# Patient Record
Sex: Male | Born: 1955
Health system: Southern US, Community
[De-identification: ages and names within clinical notes are randomized; demographics above are authoritative.]

## PROBLEM LIST (undated history)

## (undated) DIAGNOSIS — I10 Essential (primary) hypertension: Secondary | ICD-10-CM

## (undated) DIAGNOSIS — G4730 Sleep apnea, unspecified: Secondary | ICD-10-CM

## (undated) DIAGNOSIS — J189 Pneumonia, unspecified organism: Secondary | ICD-10-CM

## (undated) DIAGNOSIS — K649 Unspecified hemorrhoids: Secondary | ICD-10-CM

## (undated) DIAGNOSIS — E291 Testicular hypofunction: Secondary | ICD-10-CM

## (undated) DIAGNOSIS — T7840XA Allergy, unspecified, initial encounter: Secondary | ICD-10-CM

## (undated) DIAGNOSIS — H269 Unspecified cataract: Secondary | ICD-10-CM

## (undated) DIAGNOSIS — N200 Calculus of kidney: Secondary | ICD-10-CM

## (undated) DIAGNOSIS — K429 Umbilical hernia without obstruction or gangrene: Secondary | ICD-10-CM

## (undated) DIAGNOSIS — E079 Disorder of thyroid, unspecified: Secondary | ICD-10-CM

## (undated) DIAGNOSIS — J302 Other seasonal allergic rhinitis: Secondary | ICD-10-CM

## (undated) DIAGNOSIS — D649 Anemia, unspecified: Secondary | ICD-10-CM

## (undated) DIAGNOSIS — E785 Hyperlipidemia, unspecified: Secondary | ICD-10-CM

## (undated) DIAGNOSIS — R011 Cardiac murmur, unspecified: Secondary | ICD-10-CM

## (undated) HISTORY — PX: VASECTOMY: SHX75

## (undated) HISTORY — DX: Essential (primary) hypertension: I10

## (undated) HISTORY — DX: Allergy, unspecified, initial encounter: T78.40XA

## (undated) HISTORY — DX: Sleep apnea, unspecified: G47.30

## (undated) HISTORY — PX: TONSILLECTOMY: SHX5217

## (undated) HISTORY — DX: Hyperlipidemia, unspecified: E78.5

## (undated) HISTORY — DX: Testicular hypofunction: E29.1

## (undated) HISTORY — DX: Disorder of thyroid, unspecified: E07.9

## (undated) HISTORY — PX: UVULOPALATOPHARYNGOPLASTY: SHX827

## (undated) HISTORY — PX: EYE SURGERY: SHX253

## (undated) HISTORY — DX: Unspecified cataract: H26.9

## (undated) HISTORY — PX: WISDOM TOOTH EXTRACTION: SHX21

## (undated) HISTORY — DX: Cardiac murmur, unspecified: R01.1

---

## 2004-08-08 ENCOUNTER — Encounter: Admission: RE | Admit: 2004-08-08 | Discharge: 2004-08-08 | Payer: Self-pay | Admitting: Family Medicine

## 2004-09-13 ENCOUNTER — Ambulatory Visit (HOSPITAL_COMMUNITY): Admission: RE | Admit: 2004-09-13 | Discharge: 2004-09-13 | Payer: Self-pay | Admitting: Gastroenterology

## 2004-09-13 ENCOUNTER — Encounter (INDEPENDENT_AMBULATORY_CARE_PROVIDER_SITE_OTHER): Payer: Self-pay | Admitting: Specialist

## 2005-09-01 ENCOUNTER — Ambulatory Visit: Payer: Self-pay | Admitting: Internal Medicine

## 2005-09-17 ENCOUNTER — Ambulatory Visit (HOSPITAL_BASED_OUTPATIENT_CLINIC_OR_DEPARTMENT_OTHER): Admission: RE | Admit: 2005-09-17 | Discharge: 2005-09-17 | Payer: Self-pay | Admitting: Otolaryngology

## 2005-09-21 ENCOUNTER — Ambulatory Visit: Payer: Self-pay | Admitting: Internal Medicine

## 2005-10-14 ENCOUNTER — Encounter (INDEPENDENT_AMBULATORY_CARE_PROVIDER_SITE_OTHER): Payer: Self-pay | Admitting: Specialist

## 2005-10-14 ENCOUNTER — Ambulatory Visit (HOSPITAL_COMMUNITY): Admission: RE | Admit: 2005-10-14 | Discharge: 2005-10-15 | Payer: Self-pay | Admitting: Otolaryngology

## 2006-12-11 ENCOUNTER — Encounter: Payer: Self-pay | Admitting: Family Medicine

## 2007-06-02 ENCOUNTER — Ambulatory Visit: Payer: Self-pay | Admitting: Family Medicine

## 2007-06-02 DIAGNOSIS — K649 Unspecified hemorrhoids: Secondary | ICD-10-CM

## 2007-06-02 DIAGNOSIS — E782 Mixed hyperlipidemia: Secondary | ICD-10-CM | POA: Insufficient documentation

## 2007-06-02 DIAGNOSIS — E039 Hypothyroidism, unspecified: Secondary | ICD-10-CM | POA: Insufficient documentation

## 2007-06-02 DIAGNOSIS — R51 Headache: Secondary | ICD-10-CM

## 2007-06-02 DIAGNOSIS — J45909 Unspecified asthma, uncomplicated: Secondary | ICD-10-CM | POA: Insufficient documentation

## 2007-06-02 DIAGNOSIS — R040 Epistaxis: Secondary | ICD-10-CM

## 2007-06-02 DIAGNOSIS — M722 Plantar fascial fibromatosis: Secondary | ICD-10-CM

## 2007-06-02 DIAGNOSIS — I10 Essential (primary) hypertension: Secondary | ICD-10-CM | POA: Insufficient documentation

## 2007-06-03 ENCOUNTER — Telehealth: Payer: Self-pay | Admitting: Family Medicine

## 2007-06-09 ENCOUNTER — Encounter: Payer: Self-pay | Admitting: Family Medicine

## 2007-06-28 ENCOUNTER — Encounter: Payer: Self-pay | Admitting: Family Medicine

## 2007-08-03 ENCOUNTER — Encounter (INDEPENDENT_AMBULATORY_CARE_PROVIDER_SITE_OTHER): Payer: Self-pay | Admitting: General Surgery

## 2007-08-03 ENCOUNTER — Ambulatory Visit (HOSPITAL_COMMUNITY): Admission: RE | Admit: 2007-08-03 | Discharge: 2007-08-03 | Payer: Self-pay | Admitting: General Surgery

## 2007-08-03 HISTORY — PX: HERNIA REPAIR: SHX51

## 2007-08-09 ENCOUNTER — Ambulatory Visit: Payer: Self-pay | Admitting: Family Medicine

## 2007-08-09 LAB — CONVERTED CEMR LAB
Glucose, Urine, Semiquant: NEGATIVE
Nitrite: NEGATIVE
Protein, U semiquant: NEGATIVE
Specific Gravity, Urine: 1.01
Urobilinogen, UA: 0.2

## 2007-08-10 LAB — CONVERTED CEMR LAB
ALT: 26 units/L (ref 0–53)
Albumin: 4.2 g/dL (ref 3.5–5.2)
BUN: 23 mg/dL (ref 6–23)
Basophils Absolute: 0 10*3/uL (ref 0.0–0.1)
Basophils Relative: 0.1 % (ref 0.0–1.0)
Chloride: 108 meq/L (ref 96–112)
Cholesterol: 173 mg/dL (ref 0–200)
Eosinophils Absolute: 0.1 10*3/uL (ref 0.0–0.7)
Eosinophils Relative: 2 % (ref 0.0–5.0)
LDL Cholesterol: 113 mg/dL — ABNORMAL HIGH (ref 0–99)
Lymphocytes Relative: 10.7 % — ABNORMAL LOW (ref 12.0–46.0)
MCV: 88.7 fL (ref 78.0–100.0)
Neutro Abs: 5.7 10*3/uL (ref 1.4–7.7)
Neutrophils Relative %: 79.9 % — ABNORMAL HIGH (ref 43.0–77.0)
PSA: 0.45 ng/mL (ref 0.10–4.00)
Platelets: 251 10*3/uL (ref 150–400)
RBC: 4.76 M/uL (ref 4.22–5.81)
TSH: 0.5 microintl units/mL (ref 0.35–5.50)
Total CHOL/HDL Ratio: 4.5
VLDL: 22 mg/dL (ref 0–40)
WBC: 7.1 10*3/uL (ref 4.5–10.5)

## 2007-08-13 ENCOUNTER — Telehealth: Payer: Self-pay | Admitting: Family Medicine

## 2007-08-19 ENCOUNTER — Encounter: Admission: RE | Admit: 2007-08-19 | Discharge: 2007-08-19 | Payer: Self-pay | Admitting: Family Medicine

## 2007-08-19 ENCOUNTER — Encounter: Payer: Self-pay | Admitting: Family Medicine

## 2007-10-04 ENCOUNTER — Encounter: Payer: Self-pay | Admitting: Family Medicine

## 2007-12-24 ENCOUNTER — Telehealth: Payer: Self-pay | Admitting: Family Medicine

## 2008-01-03 ENCOUNTER — Encounter: Admission: RE | Admit: 2008-01-03 | Discharge: 2008-01-03 | Payer: Self-pay | Admitting: Family Medicine

## 2008-02-08 ENCOUNTER — Telehealth: Payer: Self-pay | Admitting: Family Medicine

## 2008-03-27 ENCOUNTER — Telehealth: Payer: Self-pay | Admitting: Family Medicine

## 2008-03-27 ENCOUNTER — Ambulatory Visit: Payer: Self-pay | Admitting: Family Medicine

## 2008-03-27 DIAGNOSIS — J019 Acute sinusitis, unspecified: Secondary | ICD-10-CM | POA: Insufficient documentation

## 2008-05-05 ENCOUNTER — Emergency Department (HOSPITAL_COMMUNITY): Admission: EM | Admit: 2008-05-05 | Discharge: 2008-05-05 | Payer: Self-pay | Admitting: Emergency Medicine

## 2008-08-02 ENCOUNTER — Telehealth: Payer: Self-pay | Admitting: Family Medicine

## 2008-08-14 ENCOUNTER — Ambulatory Visit: Payer: Self-pay | Admitting: Family Medicine

## 2008-08-14 DIAGNOSIS — E119 Type 2 diabetes mellitus without complications: Secondary | ICD-10-CM | POA: Insufficient documentation

## 2008-08-14 LAB — CONVERTED CEMR LAB
Bilirubin Urine: NEGATIVE
Blood in Urine, dipstick: NEGATIVE
Glucose, Urine, Semiquant: NEGATIVE
Ketones, urine, test strip: NEGATIVE
Nitrite: NEGATIVE
Protein, U semiquant: NEGATIVE
Specific Gravity, Urine: 1.015
Urobilinogen, UA: 0.2
WBC Urine, dipstick: NEGATIVE
pH: 7.5

## 2008-08-16 LAB — CONVERTED CEMR LAB
ALT: 24 units/L (ref 0–53)
AST: 19 units/L (ref 0–37)
Alkaline Phosphatase: 66 units/L (ref 39–117)
Basophils Absolute: 0 10*3/uL (ref 0.0–0.1)
Basophils Relative: 0.1 % (ref 0.0–3.0)
Bilirubin, Direct: 0.1 mg/dL (ref 0.0–0.3)
Cholesterol: 190 mg/dL (ref 0–200)
Creatinine,U: 31 mg/dL
Eosinophils Absolute: 0.1 10*3/uL (ref 0.0–0.7)
GFR calc non Af Amer: 93.12 mL/min (ref >60)
Hemoglobin: 15.4 g/dL (ref 13.0–17.0)
Hgb A1c MFr Bld: 5.9 % (ref 4.6–6.5)
Lymphocytes Relative: 15 % (ref 12.0–46.0)
MCHC: 34.7 g/dL (ref 30.0–36.0)
MCV: 86.8 fL (ref 78.0–100.0)
Microalb Creat Ratio: 6.5 mg/g (ref 0.0–30.0)
Monocytes Absolute: 0.6 10*3/uL (ref 0.1–1.0)
RDW: 12.8 % (ref 11.5–14.6)
Sodium: 138 meq/L (ref 135–145)
Total Protein: 6.9 g/dL (ref 6.0–8.3)
Triglycerides: 203 mg/dL — ABNORMAL HIGH (ref 0.0–149.0)
WBC: 7.8 10*3/uL (ref 4.5–10.5)

## 2009-01-08 ENCOUNTER — Telehealth: Payer: Self-pay | Admitting: Family Medicine

## 2009-02-06 ENCOUNTER — Ambulatory Visit: Payer: Self-pay | Admitting: Gastroenterology

## 2009-02-19 ENCOUNTER — Ambulatory Visit: Payer: Self-pay | Admitting: Gastroenterology

## 2009-02-19 HISTORY — PX: COLONOSCOPY: SHX174

## 2009-02-19 LAB — HM COLONOSCOPY

## 2009-02-28 ENCOUNTER — Encounter: Payer: Self-pay | Admitting: Gastroenterology

## 2009-04-17 ENCOUNTER — Telehealth: Payer: Self-pay | Admitting: Family Medicine

## 2009-04-18 ENCOUNTER — Ambulatory Visit: Payer: Self-pay | Admitting: Family Medicine

## 2009-08-09 ENCOUNTER — Encounter: Payer: Self-pay | Admitting: Family Medicine

## 2009-08-13 ENCOUNTER — Telehealth: Payer: Self-pay | Admitting: Family Medicine

## 2009-09-20 ENCOUNTER — Ambulatory Visit: Payer: Self-pay | Admitting: Family Medicine

## 2009-09-20 DIAGNOSIS — Z77090 Contact with and (suspected) exposure to asbestos: Secondary | ICD-10-CM | POA: Insufficient documentation

## 2009-09-20 LAB — CONVERTED CEMR LAB
Bilirubin Urine: NEGATIVE
Blood in Urine, dipstick: NEGATIVE
Glucose, Urine, Semiquant: NEGATIVE
Ketones, urine, test strip: NEGATIVE
Urobilinogen, UA: 0.2
WBC Urine, dipstick: NEGATIVE

## 2009-09-21 LAB — CONVERTED CEMR LAB
AST: 21 units/L (ref 0–37)
Albumin: 4.5 g/dL (ref 3.5–5.2)
BUN: 25 mg/dL — ABNORMAL HIGH (ref 6–23)
Bilirubin, Direct: 0.1 mg/dL (ref 0.0–0.3)
Calcium: 9.2 mg/dL (ref 8.4–10.5)
Eosinophils Relative: 1.2 % (ref 0.0–5.0)
GFR calc non Af Amer: 114.46 mL/min (ref >60)
LDL Cholesterol: 84 mg/dL (ref 0–99)
Lymphocytes Relative: 16.8 % (ref 12.0–46.0)
Lymphs Abs: 1.4 10*3/uL (ref 0.7–4.0)
MCHC: 34.6 g/dL (ref 30.0–36.0)
Monocytes Absolute: 0.6 10*3/uL (ref 0.1–1.0)
Monocytes Relative: 7.4 % (ref 3.0–12.0)
Neutro Abs: 6 10*3/uL (ref 1.4–7.7)
Neutrophils Relative %: 74.1 % (ref 43.0–77.0)
PSA: 0.67 ng/mL (ref 0.10–4.00)
Sodium: 139 meq/L (ref 135–145)
TSH: 1.21 microintl units/mL (ref 0.35–5.50)
Total Protein: 7.3 g/dL (ref 6.0–8.3)
VLDL: 31.8 mg/dL (ref 0.0–40.0)
WBC: 8.1 10*3/uL (ref 4.5–10.5)

## 2009-12-20 ENCOUNTER — Telehealth: Payer: Self-pay | Admitting: Family Medicine

## 2010-01-10 ENCOUNTER — Emergency Department: Payer: Self-pay | Admitting: Emergency Medicine

## 2010-03-31 DIAGNOSIS — J189 Pneumonia, unspecified organism: Secondary | ICD-10-CM

## 2010-03-31 HISTORY — DX: Pneumonia, unspecified organism: J18.9

## 2010-04-30 NOTE — Assessment & Plan Note (Signed)
Summary: SINUSITIS? // RS   Vital Signs:  Patient profile:   55 year old male Weight:      222 pounds Temp:     99.0 degrees F oral BP sitting:   110 / 72  (left arm) Cuff size:   regular  Vitals Entered By: Alfred Levins, CMA (April 18, 2009 10:19 AM) CC: cough, congestion.  He has tried mucinex and dayquil   History of Present Illness: One week of sinus pressure, PND, HA, ST, and a dry cough. No fever. On Mucinex.   Current Medications (verified): 1)  Pravastatin Sodium 40 Mg  Tabs (Pravastatin Sodium) .... Once Daily 2)  Fish Oil   Oil (Fish Oil) .... 3 Once Daily 3)  Verapamil Hcl Cr 240 Mg  Cp24 (Verapamil Hcl) .... Once Daily 4)  Zestoretic 20-12.5 Mg  Tabs (Lisinopril-Hydrochlorothiazide) .... Once Daily 5)  Glucophage Xr 500 Mg Xr24h-Tab (Metformin Hcl) .... Take One Tab Two Times A Day 6)  Centrum Silver Ultra Mens  Tabs (Multiple Vitamins-Minerals) .... Once Daily 7)  Cinnamon 500 Mg Caps (Cinnamon) .... Take One Tab Two Times A Day 8)  Acai Berry 500 Mg Caps (Acai) .... Take Once Daily  Allergies (verified): 1)  ! Pcn  Past History:  Past Medical History: Reviewed history from 08/14/2008 and no changes required. cluster migraines, had seen [facility] Hyperlipidemia Hypertension heart murmur as a child sleep apnea, saw Dr. Fannie Knee Hypothyroidism Asthma, exertional insulin resistance, sees Dr. Talmage Nap  Review of Systems  The patient denies anorexia, fever, weight loss, weight gain, vision loss, decreased hearing, hoarseness, chest pain, syncope, dyspnea on exertion, peripheral edema, hemoptysis, abdominal pain, melena, hematochezia, severe indigestion/heartburn, hematuria, incontinence, genital sores, muscle weakness, suspicious skin lesions, transient blindness, difficulty walking, depression, unusual weight change, abnormal bleeding, enlarged lymph nodes, angioedema, breast masses, and testicular masses.    Physical Exam  General:   Well-developed,well-nourished,in no acute distress; alert,appropriate and cooperative throughout examination Head:  Normocephalic and atraumatic without obvious abnormalities. No apparent alopecia or balding. Eyes:  No corneal or conjunctival inflammation noted. EOMI. Perrla. Funduscopic exam benign, without hemorrhages, exudates or papilledema. Vision grossly normal. Ears:  External ear exam shows no significant lesions or deformities.  Otoscopic examination reveals clear canals, tympanic membranes are intact bilaterally without bulging, retraction, inflammation or discharge. Hearing is grossly normal bilaterally. Nose:  External nasal examination shows no deformity or inflammation. Nasal mucosa are pink and moist without lesions or exudates. Mouth:  Oral mucosa and oropharynx without lesions or exudates.  Teeth in good repair. Neck:  No deformities, masses, or tenderness noted. Lungs:  Normal respiratory effort, chest expands symmetrically. Lungs are clear to auscultation, no crackles or wheezes.   Impression & Recommendations:  Problem # 1:  ACUTE SINUSITIS, UNSPECIFIED (ICD-461.9)  His updated medication list for this problem includes:    Zithromax Z-pak 250 Mg Tabs (Azithromycin) .Marland Kitchen... As directed    Hydromet 5-1.5 Mg/32ml Syrp (Hydrocodone-homatropine) .Marland Kitchen... 1 q 4 hours as needed cough  Complete Medication List: 1)  Pravastatin Sodium 40 Mg Tabs (Pravastatin sodium) .... Once daily 2)  Fish Oil Oil (Fish oil) .... 3 once daily 3)  Verapamil Hcl Cr 240 Mg Cp24 (Verapamil hcl) .... Once daily 4)  Zestoretic 20-12.5 Mg Tabs (Lisinopril-hydrochlorothiazide) .... Once daily 5)  Glucophage Xr 500 Mg Xr24h-tab (Metformin hcl) .... Take one tab two times a day 6)  Centrum Silver Ultra Mens Tabs (Multiple vitamins-minerals) .... Once daily 7)  Cinnamon 500 Mg Caps (Cinnamon) .Marland KitchenMarland KitchenMarland Kitchen  Take one tab two times a day 8)  Acai Berry 500 Mg Caps (Acai) .... Take once daily 9)  Zithromax Z-pak 250 Mg Tabs  (Azithromycin) .... As directed 10)  Hydromet 5-1.5 Mg/58ml Syrp (Hydrocodone-homatropine) .Marland Kitchen.. 1 q 4 hours as needed cough  Patient Instructions: 1)  Please schedule a follow-up appointment as needed .  Prescriptions: HYDROMET 5-1.5 MG/5ML SYRP (HYDROCODONE-HOMATROPINE) 1 q 4 hours as needed cough  #240 x 0   Entered and Authorized by:   Nelwyn Salisbury MD   Signed by:   Nelwyn Salisbury MD on 04/18/2009   Method used:   Print then Give to Patient   RxID:   [id number] ZITHROMAX Z-PAK 250 MG TABS (AZITHROMYCIN) as directed  #1 x 0   Entered and Authorized by:   Nelwyn Salisbury MD   Signed by:   Nelwyn Salisbury MD on 04/18/2009   Method used:   Print then Give to Patient   RxID:   [id number]

## 2010-04-30 NOTE — Assessment & Plan Note (Signed)
Summary: cpx/pt will come in fasting/njr rsc bmp/njr   Vital Signs:  Patient profile:   55 year old male Weight:      215 pounds BMI:     36.75 BP sitting:   130 / 78  (left arm) Cuff size:   regular  Vitals Entered By: Raechel Ache, RN (September 20, 2009 9:09 AM) CC: CPX, fasting. Wants to be checked for asbestos lung- worked in [employer] for 7 yrs.   History of Present Illness: 55 yr old male for a cpx. He has a number of questions today. he feels fairly well in general. He watches his diet closely, and his DM has been well controlled. His A1c was excellent last month when he saw Dr. Talmage Nap. However he is frustrated by his inablility to lose weight. He admits to getting no exercise at all other than the walking he does on his job every day. 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 He denies any SOB.  Allergies: 1)  ! Pcn  Past History:  Past Medical History: cluster migraines, had seen [facility] Hyperlipidemia Hypertension heart murmur as a child sleep apnea, saw Dr. Fannie Knee Hypothyroidism Asthma, exertional type II DM, sees Dr. Talmage Nap  Past Surgical History: Tonsillectomy uvulopalatopharyngoplasty per Dr. Haroldine Laws Inguinal herniorrhaphy, left RK on eyes Vasectomy Hemorrhoidectomy per Dr. Carolynne Edouard 08-03-07 colonoscopy 02-19-09 per Dr. Arlyce Dice, repeat 10 yrs  Family History: Reviewed history from 06/02/2007 and no changes required. Family History of CAD Male 1st degree relative <50 Family History High cholesterol Family History of Stroke M 1st degree relative <50  Social History: Reviewed history from 06/02/2007 and no changes required. 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    Physical Exam  General:  overweight-appearing.   Head:  Normocephalic and atraumatic without obvious abnormalities. No apparent alopecia or balding. Eyes:  No corneal or conjunctival inflammation noted. EOMI. Perrla. Funduscopic exam benign, without hemorrhages, exudates or papilledema. Vision grossly normal. Ears:  External ear exam shows no significant lesions or deformities.  Otoscopic examination reveals clear canals, tympanic membranes are intact bilaterally without bulging, retraction, inflammation or discharge. Hearing is grossly normal bilaterally. Nose:  External nasal examination shows no deformity or inflammation. Nasal mucosa are pink and moist without lesions or exudates. Mouth:  Oral mucosa and oropharynx without lesions or exudates.  Teeth in good repair. Neck:  No deformities, masses, or tenderness noted. Chest Wall:  No deformities, masses, tenderness or gynecomastia noted. Lungs:  Normal respiratory effort, chest expands symmetrically. Lungs are clear to auscultation, no crackles or wheezes. Heart:  Normal rate and regular rhythm. S1 and S2 normal without gallop, murmur, click, rub or other extra sounds. EKG normal Abdomen:  Bowel sounds positive,abdomen soft and non-tender without masses, organomegaly or hernias noted. Rectal:  No external abnormalities noted. Normal sphincter tone. No rectal masses or tenderness. Heme neg. Genitalia:  Testes bilaterally descended without nodularity, tenderness or masses. No scrotal masses or  lesions. No penis lesions or urethral discharge. Prostate:  Prostate gland firm and smooth, no enlargement, nodularity, tenderness, mass, asymmetry or induration. Msk:  No deformity or scoliosis noted of thoracic or lumbar spine.   Pulses:  R and L carotid,radial,femoral,dorsalis pedis and posterior tibial pulses are full and equal bilaterally Extremities:  No clubbing, cyanosis, edema, or deformity noted with normal full range of motion of all joints.   Neurologic:  No cranial nerve deficits noted. Station and gait are normal. Plantar reflexes are down-going bilaterally. DTRs are symmetrical throughout. Sensory, motor and coordinative functions appear intact. Skin:  Intact without suspicious lesions or rashes Cervical Nodes:  No lymphadenopathy noted Axillary Nodes:  No palpable lymphadenopathy Inguinal Nodes:  No significant adenopathy Psych:  Cognition and judgment appear intact. Alert and cooperative with normal attention span and concentration. No apparent delusions, illusions, hallucinations   Impression & Recommendations:  Problem # 1:  WELL ADULT EXAM (ICD-V70.0)  Orders: UA Dipstick w/o Micro (automated)  (81003) Hemoccult Guaiac-1 spec.(in office) (82270) EKG w/ Interpretation (93000) Venipuncture (65784) TLB-Lipid Panel (80061-LIPID) TLB-BMP (Basic Metabolic Panel-BMET) (80048-METABOL) TLB-CBC Platelet - w/Differential (85025-CBCD) TLB-Hepatic/Liver Function Pnl (80076-HEPATIC) TLB-TSH (Thyroid Stimulating Hormone) (84443-TSH) TLB-PSA (Prostate Specific Antigen) (84153-PSA)  Complete Medication List: 1)  Pravastatin Sodium 40 Mg Tabs (Pravastatin sodium) .... Once daily 2)  Fish Oil Oil (Fish oil) .... 3 once daily 3)  Verapamil Hcl Cr 240 Mg Cp24 (Verapamil hcl) .... Once daily 4)  Zestoretic 20-12.5 Mg Tabs (Lisinopril-hydrochlorothiazide) .... Once daily 5)  Glucophage Xr 500 Mg Xr24h-tab (Metformin hcl) .... Take 2 tabs  two times a day 6)  Centrum Silver Ultra Mens  Tabs (Multiple vitamins-minerals) .... Once daily 7)  Cinnamon 500 Mg Caps (Cinnamon) .... Take one tab two times a day 8)  Acai Berry 500 Mg Caps (Acai) .... Take once daily 9)  Prednisone (pak) 10 Mg Tabs (Prednisone) .... As directed for 6 days 10)  Hydromet 5-1.5 Mg/71ml Syrp (Hydrocodone-homatropine) .Marland Kitchen.. 1 tsp q 4 hours as needed cough  Other Orders: T-2 View CXR (71020TC)  Patient Instructions: 1)  Get labs today.  2)  It is important that you exercise reguarly at least 20 minutes 5 times a week. If you develop chest pain, have severe difficulty breathing, or feel very tired, stop exercising immediately and seek medical attention.  3)  You need to lose weight. Consider a lower calorie diet and regular exercise.  4)  set up a CXR soon Prescriptions: HYDROMET 5-1.5 MG/5ML SYRP (HYDROCODONE-HOMATROPINE) 1 tsp q 4 hours as needed cough  #240 x 0   Entered and Authorized by:   Nelwyn Salisbury MD   Signed by:   Nelwyn Salisbury MD on 09/20/2009   Method used:   Print then Give to Patient   RxID:   [id number] PREDNISONE (PAK) 10 MG TABS (PREDNISONE) as directed for 6 days  #1 x 1   Entered and Authorized by:   Nelwyn Salisbury MD   Signed by:   Nelwyn Salisbury MD on 09/20/2009   Method used:   Print then Give to Patient   RxID:   [id number] ZESTORETIC 20-12.5 MG  TABS (LISINOPRIL-HYDROCHLOROTHIAZIDE) once daily  #90 x 3   Entered and Authorized by:   Nelwyn Salisbury MD   Signed by:   Nelwyn Salisbury MD on 09/20/2009   Method used:   Print then Give to Patient   RxID:   [id number] VERAPAMIL HCL CR 240 MG  CP24 (VERAPAMIL HCL)  once daily  #90 x 3   Entered and Authorized by:   Nelwyn Salisbury MD   Signed by:   Nelwyn Salisbury MD on 09/20/2009   Method used:   Print then Give to Patient   RxID:   [id number] PRAVASTATIN SODIUM 40 MG  TABS (PRAVASTATIN SODIUM) once daily  #90 x 3   Entered and Authorized by:   Nelwyn Salisbury MD   Signed by:   Nelwyn Salisbury MD on 09/20/2009    Method used:   Print then Give to Patient   RxID:   [id number]   Laboratory Results   Urine Tests    Routine Urinalysis   Color: yellow Appearance: Clear Glucose: negative   (Normal Range: Negative) Bilirubin: negative   (Normal Range: Negative) Ketone: negative   (Normal Range: Negative) Spec. Gravity: 1.025   (Normal Range: 1.003-1.035) Blood: negative   (Normal Range: Negative) pH: 5.0   (Normal Range: 5.0-8.0) Protein: negative   (Normal Range: Negative) Urobilinogen: 0.2   (Normal Range: 0-1) Nitrite: negative   (Normal Range: Negative) Leukocyte Esterace: negative   (Normal Range: Negative)    Comments: Rita Ohara  September 20, 2009 9:12 AM

## 2010-04-30 NOTE — Progress Notes (Signed)
Summary: REQ FOR RETURN CALL  Phone Note Call from Patient   Caller: Patient   [phone] Summary of Call: Pt called in to speak with Dr Clent Ridges or his nurse.... Pt adv that he is having problems with hemorrhoids .... had surgery approx 2 yrs ago (Dr Carolynne Edouard).... Pt now exp sxs blood in stool even with a normal BM.... Pt adv that he spent over 45 min in the bathroom this am trying to get the bleeding to stop.... Pt denies any pain, nausea or other sxs associated with same.... Pt would like to discuss if he needs to go back to Dr Carolynne Edouard?  Pt was offered OV with Dr Clent Ridges but he declined stating that he will decide if he needs to come in for appt AFTER he speaks with Almira Coaster, RN  or  Dr Clent Ridges.  Initial call taken by: Debbra Riding,  December 20, 2009 10:06 AM  Follow-up for Phone Call        he should return to see Dr. Carolynne Edouard Follow-up by: Nelwyn Salisbury MD,  December 20, 2009 12:24 PM  Additional Follow-up for Phone Call Additional follow up Details #1::        pt aware. Additional Follow-up by: Pura Spice, RN,  December 20, 2009 12:27 PM

## 2010-04-30 NOTE — Letter (Signed)
Summary: [facility]   Imported By: Maryln Gottron 09/05/2009 11:29:56  _____________________________________________________________________  External Attachment:    Type:   Image     Comment:   External Document

## 2010-04-30 NOTE — Progress Notes (Signed)
Summary: Zpack  LMTCB 04/18/2008  Phone Note Call from Patient   Caller: Patient Call For: Nelwyn Salisbury MD Summary of Call: Pt complaining of sinus congestion and cough.  No fever and wants ZPack.  Wife's MD gave her a Zpack. [facility]) [phone] Initial call taken by: Lynann Beaver CMA,  April 17, 2009 3:52 PM  Follow-up for Phone Call        needs OV first Follow-up by: Nelwyn Salisbury MD,  April 17, 2009 5:05 PM  Additional Follow-up for Phone Call Additional follow up Details #1::        [facility] Additional Follow-up by: Lynann Beaver CMA,  April 18, 2009 8:13 AM    Additional Follow-up for Phone Call Additional follow up Details #2::    Phone Call Completed-----Pt called in and was adv of Dr Claris Che instructions...Marland KitchenMarland Kitchen Per Arline Asp, CMA - pt will come in to be worked into Dr Claris Che schedule.  Follow-up by: Debbra Riding,  April 18, 2009 9:35 AM

## 2010-04-30 NOTE — Progress Notes (Signed)
Summary: REQ FOR RX   Phone Note Call from Patient   Caller: Patient   [phone] Reason for Call: Refill Medication Summary of Call: Pt called to req that Dr Clent Ridges send in a Rx for medication: Promethazine-codeine syrup....[facility] [place] KitchenMarland Kitchen Pt adv that he was prescribed this med by his former physician before he switched to Dr Clent Ridges .... Pt adv that he has been exp productive cough and he had some of this med left from a previous Rx and it has been working well.... Pt was offered OV with Dr Clent Ridges but declined - pt adv that he is scheduled to come in next month for a cpx and can't take the time off of work.... Pt adv that he would like his request sent to Dr Clent Ridges.  Initial call taken by: Debbra Riding,  Aug 13, 2009 9:08 AM  Follow-up for Phone Call        No I cannot prescribe narcotic medications sight unseen. He needs an OV Follow-up by: Nelwyn Salisbury MD,  Aug 13, 2009 1:25 PM  Additional Follow-up for Phone Call Additional follow up Details #1::        Phone Call Completed----Pt was advised of this when he initially called in.... Contacted pt and reiterated info from Dr Claris Che notation...Marland KitchenMarland KitchenPt still declined OV, adv he would wait till his cpx in June 2011.  Additional Follow-up by: Debbra Riding,  Aug 13, 2009 2:29 PM

## 2010-07-03 LAB — GLUCOSE, CAPILLARY: Glucose-Capillary: 100 mg/dL — ABNORMAL HIGH (ref 70–99)

## 2010-08-13 NOTE — Op Note (Signed)
NAME:  Alejandro Hill, Alejandro Hill NO.:  [id number]   MEDICAL RECORD NO.:  [id number]          PATIENT TYPE:  AMB   LOCATION:  DAY                          FACILITY:  [facility]   PHYSICIAN:  Jaylen Knope. Vernell Morgans, M.D. DATE OF BIRTH:  06-Jan-1956   DATE OF PROCEDURE:  08/03/2007  DATE OF DISCHARGE:                               OPERATIVE REPORT   PREOPERATIVE DIAGNOSIS:  Internal hemorrhoids.   POSTOPERATIVE DIAGNOSIS:  Internal hemorrhoids.   PROCEDURE:  PPH hemorrhoidectomy.   SURGEON:  Lani Mendiola. Vernell Morgans, M.D.   ANESTHESIA:  General endotracheal.   DESCRIPTION OF PROCEDURE:  After informed consent was obtained, the  patient was brought to the operating room and left in the supine  position on the stretcher.  After adequate induction of general  anesthesia, the patient was moved into a prone position on the operating  table and all pressure points were padded.  The patient was then placed  in a jackknife type position.  His buttocks were retracted laterally  with tape.  His perirectal area was then prepped with Betadine and  draped in the usual sterile fashion.  The perirectal area was then  infiltrated with 0.25% Marcaine with 1 mL of Wydase and the tissue was  massaged gently for several minutes.  A small bullet retractor was  inserted in the rectum.  The patient had significant internal  hemorrhoidal tissue but no other significant abnormalities were noted.  There was some element of prolapse to these internal hemorrhoids.  Next  a deep Pfannenstiel retractor was placed in the rectum.  A 2-0 Prolene  pursestring stitch was placed circumferentially around the inside of the  rectum gathering only mucosa and submucosa.  This was done between 4 and  4.5 cm deep to the dentate line.  Once this pursestring stitch was  placed, the Pfannenstiel retractor was removed.  The tails of the  Prolene stitch were brought through a clear plastic retractor and white  dilator.  The retractor and  dilator were then placed in the rectum.  The  retractor was held in place and the white dilator was removed.  The PPH  stapling device was then placed in the rectum and the tip of the anvil  was felt to fall beneath the pursestring stitch.  The pursestring stitch  was then cinched down and tied.  The tails of the Prolene were then  brought through the lateral holes on the stapling.  Air knot was tied  for retraction purposes.  While giving gentle traction on the Prolene  stitch, the PPH stapling device was inserted into the rectum and closed  completely.  Once this was accomplished, the [facility] stapling device  appeared to be in very good position.  A minute was allowed to pass.  The stapling device was then fired and another minute was allowed to  pass.  The stapling device was then opened half a turn and removed from  the rectum.  The tissue was examined and there appeared to be good  uniform internal hemorrhoidal tissue with no muscle.  This was  sent to  pathology for further evaluation.  The staple line was evaluated and  several bleeding points were controlled with figure-of-eight 4-0 Vicryl  stitches.  Once this was accomplished the staple line was hemostatic and  in good position.  A small piece of Gelfoam with Dibucaine ointment was  placed in the rectum.  Dibucaine ointment was placed around the  perirectal  skin and then sterile dressings were applied.  The patient tolerated the  procedure well.  At the end of the case, all needle, sponge, and  instrument counts correct.  The patient was then awakened and taken to  the recovery room in stable condition.      Ollen Gross. Vernell Morgans, M.D.  Electronically Signed     PST/MEDQ  D:  08/03/2007  T:  08/03/2007  Job:  [id number]

## 2010-08-15 ENCOUNTER — Telehealth: Payer: Self-pay | Admitting: Family Medicine

## 2010-08-15 MED ORDER — VERAPAMIL HCL ER 240 MG PO TBCR: 240.0000 mg | EXTENDED_RELEASE_TABLET | Freq: Every day | ORAL | Status: DC

## 2010-08-15 NOTE — Telephone Encounter (Signed)
rx sent

## 2010-08-16 NOTE — Op Note (Signed)
NAME:  Alejandro Hill, Alejandro Hill NO.:  [id number]   MEDICAL RECORD NO.:  [id number]          PATIENT TYPE:  OIB   LOCATION:  3308                         FACILITY:  [facility]   PHYSICIAN:  Hermelinda Medicus, M.D.   DATE OF BIRTH:  [birth year]   DATE OF PROCEDURE:  10/14/2005  DATE OF DISCHARGE:                                 OPERATIVE REPORT   __________   PREOPERATIVE DIAGNOSIS:  Sleep apnea with septal deviation, nasal  obstruction, with obesity, with failure of CPAP.   POSTOPERATIVE DIAGNOSIS:  Sleep apnea with septal deviation, nasal  obstruction, with obesity, with failure of CPAP.   OPERATION:  Septal reconstruction, turbinate reduction, LA of the  tonsillectomy and a UPP (uvulopalatoplasty).   SURGEON:  Hermelinda Medicus, M.D.   ANESTHESIA:  General endotracheal with Dr. Katrinka Blazing.   PROCEDURE:  The patient placed in a supine position under general  endotracheal anesthesia.  The nose was first approached using topical  cocaine 200 mg and 1% Xylocaine with epinephrine.  The inferior turbinates  were aggressively out fractured in an effort to gain considerable space.  We  were able to gain considerable space by doing this.  The middle turbinates  were in quite good condition and were not excessively sized.  The septum was  however, deviated considerably over to the right and therefore, a  hemitransfixion incision was made on the right side, carried back along the  quadrilateral cartilage to the posterior quadrilateral cartilage, where a  strip was a taken.  A strip of cartilage was taken from the floor of the  nose as this was off the premaxillary crest to the right.  The open and  close [facility] were then used to correct the ethmoid and bone  __________ septal deviation, and to bring that, then we were able to bring  the septum to the midline and gain some excellent space on both sides of his  nose.  Closure was begun with 5-0 plain catgut and a through and  through  septal suture using 4-0 plain x2 as an intranasal dressing.  Once this was  completed, then we placed a Telfa temporarily to prevent any swelling  occurring while we did the tonsillectomy.  We then repositioned and then  placed the tonsillar gag and the tonsils were removed using the sharp blunt  and Bovie electrocoagulation.  Hemostasis was established with Bovie  electrocoagulation.  The palate was raised in contour and then the uvula,  which was about 3 times the size of normal, which is typical of someone who  snores a great deal was partially removed and trimmed back to a normal  status.  The anterior and posterior mucous membrane was approximated with 5-  0 plain catgut.  All hemostasis was established with Bovie  electrocoagulation.  The nasopharynx was checked.  The Telfa dressings were  removed and a #8 anesthesia trumpet was placed on the right and a Merocel  pack and a #6 anesthesia trumpet was placed on the left.  The stomach was  suctioned.  Blood loss was estimated at 150 mL,  and the nasopharynx was  checked to make sure all bleeding was stopped.  The patient tolerated the  procedure very well and is doing well postop.   Follow up will be in 3300.  We are using a pulse oximeter to observe him  overnight on a 23 hour observation, and then we will be seeing him on  Thursday and then in a week and a half, and then 3 weeks, 4 weeks, 6 weeks  and 3 months and 6 months.           ______________________________  Hermelinda Medicus, M.D.     JC/MEDQ  D:  10/14/2005  T:  10/14/2005  Job:  [id number]

## 2010-08-16 NOTE — H&P (Signed)
NAME:  Alejandro Hill, Alejandro Hill NO.:  [id number]   MEDICAL RECORD NO.:  [id number]          PATIENT TYPE:  OIB   LOCATION:  2550                         FACILITY:  [facility]   PHYSICIAN:  Hermelinda Medicus, M.D.   DATE OF BIRTH:  1955-06-16   DATE OF ADMISSION:  10/14/2005  DATE OF DISCHARGE:                                HISTORY & PHYSICAL   This patient is a 55 year old male who has had sleep apnea issues in the  past.  He has been overweight, weighed at one time 195 pounds, now he weighs  approximately 230, and had a polysomnogram done in 1998, which showed a  considerable RDI of 13, lowest O2 was 76.  He did not want to use CPAP, had  an intolerance to CPAP and did not use it.  He was reevaluated more  recently, having gained some weight and having a new polysomnogram and his  RDI was 37.5 and his lowest nadir was 74%, 15% of his time was spent in deep  sleep or REM sleep, where 30% is the normal.  His general health is  excellent.  He takes the Omega III vitamins.  He does have migraine issues  and also has very poor nasal airway which is probably the major factor of  his failure for the CPAP as he has a septal deviation that is quite severe  and a very narrow nose.  He now enters for a septal reconstruction and  turbinate reduction.  Furthermore, he has a very small mouth and large  tonsils which obstruct his mouth and his neck is full and pushes his tongue  high in his mouth.  Therefore, we are planning to do a tonsillectomy and a  uvulopalatoplasty.   ALLERGIES:  He is allergic to PENICILLIN.   PAST MEDICAL HISTORY:  1.  Wisdom teeth removed 20 years ago.  2.  Eye surgery, RK.  3.  In 1976, had a left hernia repair.  4.  He does have hypertension and had a murmur as a child but his blood      pressure is 140/88 today.  5.  He also gets a little bit of asthma with exertion.  6.  He has taken the pneumonia vaccine and the flu vaccine.  7.  He does get cluster  migraines and I am stating that his sinus issues, if      they are resolved, his migraines should become improved and less      frequent.   He does drink occasionally, one or two drinks a month, and never smoked.  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   PHYSICAL EXAMINATION:  VITAL SIGNS:  Blood pressure 140/88, heart rate 101,  he is 5 feet 6 inches, he weighs 226 or 102.7 kg.  HEENT:  His ears are clear.  Tympanic membranes are clear.  The oral cavity  is very small and his tonsils are very large.  His neck is full.  No  thyromegaly, cervical adenopathy or mass, however, it pushes his tongue high  in his mouth.  His nose is narrow and is obstructive in the fact that he had  a septal deviation, turbinate hypertrophy and his larynx is clear.  True  cords, false cords, epiglottis and base of tongue are clear of ulceration or  mass.  True cord mobility, gag reflex, tongue mobility, EOMs and facial  nerve are all symmetrical.  CHEST:  Within normal limits.  No wheezing, rhonchi or rales.  Increased AP  diameter.  CARDIOVASCULAR:  No mixed sounds, murmurs or gallops.  ABDOMEN:  Obese but no obvious tenderness.  EXTREMITIES:  Unremarkable.   INITIAL DIAGNOSIS:  1.  Sleep apnea with septal deviation, turbinate hypertrophy with failed      CPAP with obesity with blood pressure elevation with a penicillin      allergy.  2.  Migraine headaches.  3.  Shortness of breath on exertion.  4.  Hernia repair, left.  5.  Eye surgery, radial keratotomy.           ______________________________  Hermelinda Medicus, M.D.     JC/MEDQ  D:  10/14/2005  T:  10/14/2005  Job:  [id number]   cc:   Donia Guiles, M.D.  Fax: [phone]

## 2010-08-16 NOTE — Procedures (Signed)
NAME:  Alejandro Hill, Alejandro Hill NO.:  [id number]   MEDICAL RECORD NO.:  [id number]          PATIENT TYPE:  OUT   LOCATION:  SLEEP CENTER                 [facility]AC[facility]   PHYSICIAN:  Clinton D. Maple Hudson, M.D. DATE OF BIRTH:  [birth year]   DATE OF STUDY:  09/17/2005                              NOCTURNAL POLYSOMNOGRAM   REFERRING PHYSICIAN:  Hermelinda Medicus, M.D.   INDICATION FOR STUDY:  Hypersomnia with sleep apnea.   EPWORTH SLEEPINESS SCORE:  14/24, BMI 36.  Weight 225 pounds.   HOME MEDICATIONS:  Atenolol, Pravachol.  NPSG diagnostic protocol ordered.   SLEEP ARCHITECTURE:  Total sleep time 347 minutes with sleep efficiency 80%.  Stage I 10%, stage II 70% 70-75%, stages III and IV REM 15% of total sleep  time.  Sleep latency 34 minutes, REM latency 97 minutes, awake after sleep  onset 49 minutes, arousal index increased at 42.3 indicating increased with  sleep fragmentation.  No bedtime medication was taken.   RESPIRATORY DATA:  Apnea/hypopnea index (HPI, RDI) 37.5 obstructive events  per hour, indicating moderately severe obstructive sleep apnea/hypopnea  syndrome.  There were 91 obstructive apneas and 126 hypopneas.  The events  were not positional, equally common while supine and on left side.  REM AHI  of 59.4.  NPSG protocol followed.   OXYGEN DATA:  Very loud snoring with oxygen desaturation to a nadir of 74%.  Mean oxygen saturation through the study was 95% on room air.   CARDIAC DATA:  Normal sinus rhythm.   MOVEMENT/PARASOMNIA:  A total of 76 limb jerks were reported, of which 36  were associated with arousal or awakening for a periodic limb movement with  arousal index 6.2 per hour, which is increased.  Bathroom x1.   IMPRESSION/RECOMMENDATION:  1.  Moderately severe obstructive sleep apnea/hypopnea syndrome, AHI 37.5      per hour, with non positional events, very loud snoring and oxygen      desaturation to 74%.  2.  Consider return for CPAP titration  or evaluate for alternative therapies      as appropriate.  3.  Periodic limb movement with arousal, 6.2 per hour.      Clinton D. Maple Hudson, M.D.  Diplomate, Biomedical engineer of Sleep Medicine  Electronically Signed     CDY/MEDQ  D:  09/21/2005 12:16:27  T:  09/22/2005 08:02:47  Job:  [id number]

## 2010-08-16 NOTE — Op Note (Signed)
NAME:  BRADLEY, BOSTELMAN NO.:  [id number]   MEDICAL RECORD NO.:  [id number]          PATIENT TYPE:  AMB   LOCATION:  ENDO                         FACILITY:  [facility]   PHYSICIAN:  Graylin Shiver, M.D.   DATE OF BIRTH:  30-Apr-1955   DATE OF PROCEDURE:  09/13/2004  DATE OF DISCHARGE:                                 OPERATIVE REPORT   PROCEDURE:  Colonoscopy with polypectomy.   ENDOSCOPIST:  Graylin Shiver, M.D.   INDICATION FOR PROCEDURE:  Screening   INFORMED CONSENT:  Informed consent was obtained after explanation of the  risks of bleeding, infection and perforation.   PREMEDICATION:  Fentanyl 75 mcg IV, Versed 7.5 mg IV.   PROCEDURE:  With the patient in the left lateral decubitus position, a  rectal exam was performed.  No masses were felt.  The Olympus colonoscope  was inserted into the rectum and advanced around the colon to the cecum.  Cecal landmarks were identified.  The cecum and ascending colon were normal.  In the transverse colon, there were two 5-mm sessile polyps which were  snared and removed by snare cautery technique.  The descending colon,  sigmoid and rectum looked normal.  He tolerated the procedure well without  complications.   IMPRESSION:  Two polyps in the transverse colon which were removed by snare  cautery technique.   PLAN:  The pathology will be checked.       SFG/MEDQ  D:  09/13/2004  T:  09/14/2004  Job:  [id number]   cc:   Donia Guiles, M.D.  [address] [place]  [place] [address]  Fax: [phone]

## 2010-09-18 ENCOUNTER — Other Ambulatory Visit (INDEPENDENT_AMBULATORY_CARE_PROVIDER_SITE_OTHER): Payer: Self-pay

## 2010-09-18 ENCOUNTER — Other Ambulatory Visit: Payer: Self-pay | Admitting: Family Medicine

## 2010-09-18 DIAGNOSIS — Z0000 Encounter for general adult medical examination without abnormal findings: Secondary | ICD-10-CM

## 2010-09-18 DIAGNOSIS — T3170 Burns involving 70-79% of body surface with 0% to 9% third degree burns: Secondary | ICD-10-CM

## 2010-09-18 LAB — TSH: TSH: 1.23 u[IU]/mL (ref 0.35–5.50)

## 2010-09-18 LAB — HEPATIC FUNCTION PANEL
AST: 26 U/L (ref 0–37)
Alkaline Phosphatase: 73 U/L (ref 39–117)
Bilirubin, Direct: 0.2 mg/dL (ref 0.0–0.3)
Total Bilirubin: 1 mg/dL (ref 0.3–1.2)

## 2010-09-18 LAB — BASIC METABOLIC PANEL
Chloride: 102 mEq/L (ref 96–112)
GFR: 94.84 mL/min (ref >60.00)
Potassium: 4.3 mEq/L (ref 3.5–5.1)
Sodium: 138 mEq/L (ref 135–145)

## 2010-09-18 LAB — URINALYSIS
Hgb urine dipstick: NEGATIVE
Leukocytes, UA: NEGATIVE
Nitrite: NEGATIVE
Specific Gravity, Urine: 1.02 (ref 1.000–1.030)
Total Protein, Urine: NEGATIVE
Urobilinogen, UA: 0.2 (ref 0.0–1.0)

## 2010-09-18 LAB — CBC WITH DIFFERENTIAL/PLATELET
Eosinophils Relative: 1.6 % (ref 0.0–5.0)
Lymphocytes Relative: 19.6 % (ref 12.0–46.0)
Lymphs Abs: 1.2 10*3/uL (ref 0.7–4.0)
MCHC: 35.1 g/dL (ref 30.0–36.0)
MCV: 89.7 fl (ref 78.0–100.0)
Neutrophils Relative %: 69.3 % (ref 43.0–77.0)
Platelets: 219 10*3/uL (ref 150.0–400.0)
RDW: 12.8 % (ref 11.5–14.6)

## 2010-09-18 LAB — LIPID PANEL
Total CHOL/HDL Ratio: 4
VLDL: 28.8 mg/dL (ref 0.0–40.0)

## 2010-09-20 ENCOUNTER — Encounter: Payer: Self-pay | Admitting: *Deleted

## 2010-09-25 ENCOUNTER — Encounter: Payer: Self-pay | Admitting: Family Medicine

## 2010-09-25 ENCOUNTER — Ambulatory Visit (INDEPENDENT_AMBULATORY_CARE_PROVIDER_SITE_OTHER): Payer: BC Managed Care – PPO | Admitting: Family Medicine

## 2010-09-25 VITALS — BP 130/84 | HR 70 | Temp 98.6°F | Resp 14 | Ht 64.5 in | Wt 221.0 lb

## 2010-09-25 DIAGNOSIS — E119 Type 2 diabetes mellitus without complications: Secondary | ICD-10-CM

## 2010-09-25 DIAGNOSIS — E291 Testicular hypofunction: Secondary | ICD-10-CM

## 2010-09-25 DIAGNOSIS — Z0000 Encounter for general adult medical examination without abnormal findings: Secondary | ICD-10-CM

## 2010-09-25 LAB — TESTOSTERONE: Testosterone: 158.34 ng/dL — ABNORMAL LOW (ref 350.00–890.00)

## 2010-09-25 LAB — HEMOGLOBIN A1C: Hgb A1c MFr Bld: 5.8 % (ref 4.6–6.5)

## 2010-09-25 MED ORDER — VERAPAMIL HCL ER 240 MG PO TBCR: 240.0000 mg | EXTENDED_RELEASE_TABLET | Freq: Every day | ORAL | Status: DC

## 2010-09-25 MED ORDER — METFORMIN HCL 500 MG PO TABS: 1000.0000 mg | ORAL_TABLET | Freq: Two times a day (BID) | ORAL | Status: DC

## 2010-09-25 MED ORDER — PRAVASTATIN SODIUM 40 MG PO TABS: 40.0000 mg | ORAL_TABLET | Freq: Every day | ORAL | Status: DC

## 2010-09-25 MED ORDER — LISINOPRIL-HYDROCHLOROTHIAZIDE 20-12.5 MG PO TABS: 1.0000 | ORAL_TABLET | Freq: Every day | ORAL | Status: DC

## 2010-09-25 NOTE — Progress Notes (Signed)
  Subjective:    Patient ID: Alejandro Hill, male    DOB: 24-Mar-1956, 55 y.o.   MRN: [id number]  HPI 55 yr old male for a cpx. He feels fine in general except for some pain in the right foot after a work accident a month or so ago. He twisted the right ankle, and he has been seeing an someone at [facility] for this. He still sees Dr. Horald Pollen twice a year. He also complains of some low libido for the past year, along with some erection difficulties.    Review of Systems  Constitutional: Negative.   HENT: Negative.   Eyes: Negative.   Respiratory: Negative.   Cardiovascular: Negative.   Gastrointestinal: Negative.   Genitourinary: Negative.   Musculoskeletal: Negative.   Skin: Negative.   Neurological: Negative.   Hematological: Negative.   Psychiatric/Behavioral: Negative.        Objective:   Physical Exam  Constitutional: He is oriented to person, place, and time. He appears well-developed and well-nourished. No distress.  HENT:  Head: Normocephalic and atraumatic.  Right Ear: External ear normal.  Left Ear: External ear normal.  Nose: Nose normal.  Mouth/Throat: Oropharynx is clear and moist. No oropharyngeal exudate.  Eyes: Conjunctivae and EOM are normal. Pupils are equal, round, and reactive to light. Right eye exhibits no discharge. Left eye exhibits no discharge. No scleral icterus.  Neck: Neck supple. No JVD present. No tracheal deviation present. No thyromegaly present.  Cardiovascular: Normal rate, regular rhythm, normal heart sounds and intact distal pulses.  Exam reveals no gallop and no friction rub.   No murmur heard.      EKG normal   Pulmonary/Chest: Effort normal and breath sounds normal. No respiratory distress. He has no wheezes. He has no rales. He exhibits no tenderness.  Abdominal: Soft. Bowel sounds are normal. He exhibits no distension and no mass. There is no tenderness. There is no rebound and no guarding.  Genitourinary: Rectum normal, prostate normal  and penis normal. Guaiac negative stool. No penile tenderness.  Musculoskeletal: Normal range of motion. He exhibits no edema and no tenderness.  Lymphadenopathy:    He has no cervical adenopathy.  Neurological: He is alert and oriented to person, place, and time. He has normal reflexes. No cranial nerve deficit. He exhibits normal muscle tone. Coordination normal.  Skin: Skin is warm and dry. No rash noted. He is not diaphoretic. No erythema. No pallor.  Psychiatric: He has a normal mood and affect. His behavior is normal. Judgment and thought content normal.          Assessment & Plan:  He will work on diet and exercise. Get a testosterone level today

## 2010-10-01 ENCOUNTER — Telehealth: Payer: Self-pay | Admitting: *Deleted

## 2010-10-01 NOTE — Telephone Encounter (Signed)
Pt would like his lab results and Dr. Claris Che plan from his last office visit.

## 2010-10-01 NOTE — Telephone Encounter (Signed)
Please see the lab results and my advice

## 2010-10-03 ENCOUNTER — Telehealth: Payer: Self-pay | Admitting: Family Medicine

## 2010-10-03 MED ORDER — TESTOSTERONE 20.25 MG/ACT (1.62%) TD GEL: 4.0000 "application " | Freq: Every day | TRANSDERMAL | Status: DC

## 2010-10-03 NOTE — Telephone Encounter (Signed)
Message copied by Romualdo Bolk on Thu Oct 03, 2010 11:48 AM ------      Message from: Kathi Simpers      Created: Tue Oct 01, 2010 12:09 PM                   ----- Message -----         From: Nelwyn Salisbury         Sent: 10/01/2010   8:47 AM           To: Mervin Kung, CMA            His diabetes is under excellent control, but the testosterone level is quite low. Call in Androgel 1.62% to apply 4 pumps daily, 30 day supply with 5 rf. Recheck a level in 6 months

## 2010-10-03 NOTE — Telephone Encounter (Signed)
Pt called req lab results and options for testosterone. Pls call. Pt also re: to get copy of labs mailed to home.

## 2010-10-03 NOTE — Telephone Encounter (Signed)
This was already taken care of. We called in a rx for Androgel. See the lab report

## 2010-10-03 NOTE — Telephone Encounter (Signed)
Pt called and said that [facility] went to [facility] [place] and was told that nothing has been called in yet or sent by escript. Pt said that the pharmacy can not rcv escript. Pls call Androgel in to [phone]. Pt waiting at pharmacy.

## 2010-10-03 NOTE — Telephone Encounter (Signed)
I spoke with pt and called in script. 

## 2010-10-03 NOTE — Telephone Encounter (Signed)
Pt aware of results 

## 2010-10-04 ENCOUNTER — Other Ambulatory Visit: Payer: Self-pay | Admitting: *Deleted

## 2010-12-24 LAB — DIFFERENTIAL
Basophils Relative: 1
Eosinophils Absolute: 0.1
Eosinophils Relative: 2
Monocytes Absolute: 0.5
Monocytes Relative: 8
Neutro Abs: 4.5

## 2010-12-24 LAB — CBC
HCT: 43
Hemoglobin: 14.9
MCHC: 34.6
MCV: 86.1
Platelets: 234
RBC: 4.99
RDW: 13.5
WBC: 6.2

## 2010-12-24 LAB — BASIC METABOLIC PANEL
CO2: 28
Chloride: 106
GFR calc Af Amer: >60
Glucose, Bld: 104 — ABNORMAL HIGH
Potassium: 3.9
Sodium: 140

## 2011-01-15 ENCOUNTER — Encounter: Payer: Self-pay | Admitting: Family Medicine

## 2011-01-15 ENCOUNTER — Ambulatory Visit (INDEPENDENT_AMBULATORY_CARE_PROVIDER_SITE_OTHER): Payer: BC Managed Care – PPO | Admitting: Family Medicine

## 2011-01-15 VITALS — BP 118/68 | HR 91 | Temp 98.8°F | Wt 230.0 lb

## 2011-01-15 DIAGNOSIS — I10 Essential (primary) hypertension: Secondary | ICD-10-CM

## 2011-01-15 DIAGNOSIS — E291 Testicular hypofunction: Secondary | ICD-10-CM

## 2011-01-15 NOTE — Progress Notes (Signed)
  Subjective:    Patient ID: Alejandro Hill, male    DOB: [birth year], 55 y.o.   MRN: [id number]  HPI Here to follow up on HTN and low testosterone. He has been using Androgel for the past 4 months and he feels no different. His libido is still low. His BP is stable.    Review of Systems  Constitutional: Negative.   Respiratory: Negative.   Cardiovascular: Negative.        Objective:   Physical Exam  Constitutional: He appears well-developed and well-nourished.  Neck: Neck supple. No thyromegaly present.  Cardiovascular: Normal rate, regular rhythm, normal heart sounds and intact distal pulses.   Pulmonary/Chest: Effort normal and breath sounds normal.  Lymphadenopathy:    He has no cervical adenopathy.          Assessment & Plan:  His HTN is stable. We will check a testosterone level today

## 2011-01-21 ENCOUNTER — Telehealth: Payer: Self-pay | Admitting: Family Medicine

## 2011-01-21 MED ORDER — TESTOSTERONE 20.25 MG/ACT (1.62%) TD GEL: 6.0000 "application " | Freq: Every day | TRANSDERMAL | Status: DC

## 2011-01-21 NOTE — Telephone Encounter (Signed)
Message copied by Baldemar Friday on Tue Jan 21, 2011  5:54 PM ------      Message from: Gershon Crane A      Created: Thu Jan 16, 2011  1:33 PM       The level is up to the low end of normal now. Increase the Androgel to 6 pumps a day. Call in a 6 month supply

## 2011-01-21 NOTE — Telephone Encounter (Signed)
Left voice message for pt and script was called in.

## 2011-01-21 NOTE — Progress Notes (Signed)
Addended by: Aniceto Boss A on: 01/21/2011 05:53 PM   Modules accepted: Orders

## 2011-01-21 NOTE — Telephone Encounter (Signed)
Message copied by Baldemar Friday on Tue Jan 21, 2011  5:53 PM ------      Message from: Gershon Crane A      Created: Thu Jan 16, 2011  1:33 PM       The level is up to the low end of normal now. Increase the Androgel to 6 pumps a day. Call in a 6 month supply

## 2011-01-22 ENCOUNTER — Telehealth: Payer: Self-pay | Admitting: Family Medicine

## 2011-01-22 NOTE — Telephone Encounter (Signed)
Pharmacist needs clarification on Androgel. Need to know what strength and qty of dose.

## 2011-01-22 NOTE — Telephone Encounter (Signed)
I spoke with pharmacy 

## 2011-01-22 NOTE — Telephone Encounter (Signed)
Pharmacist called back and is waiting on clarification of Adrogel strength and qty of pumps per day. Also need to know how many refills. Pls call today.

## 2011-01-27 ENCOUNTER — Telehealth: Payer: Self-pay | Admitting: Family Medicine

## 2011-01-27 MED ORDER — TESTOSTERONE 20.25 MG/ACT (1.62%) TD GEL: 6.0000 "application " | Freq: Every day | TRANSDERMAL | Status: DC

## 2011-01-27 NOTE — Telephone Encounter (Signed)
Patient got tested for allergies with cats. See fax. 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 Please advise. Patient also has a letter from the allergist. Patient has to take allergy meds during work only. If need to email him, please do at: [email or web address]. Thanks.

## 2011-01-27 NOTE — Telephone Encounter (Signed)
I did write a note asking his boss to remove the cats from his workplace. Call him to pick this up

## 2011-01-27 NOTE — Telephone Encounter (Signed)
Pt called and requested a 3 month supply of the Androgel. Dr. Clent Ridges approved this and [facility]

## 2011-01-28 NOTE — Telephone Encounter (Signed)
I spoke with pt and he requested that I put this letter in mail.

## 2011-01-30 HISTORY — PX: CATARACT EXTRACTION: SUR2

## 2011-02-04 ENCOUNTER — Telehealth: Payer: Self-pay | Admitting: Family Medicine

## 2011-02-04 DIAGNOSIS — E291 Testicular hypofunction: Secondary | ICD-10-CM

## 2011-02-04 NOTE — Telephone Encounter (Signed)
Pt would like to start giving himself testosterone inj [facility] [phone] . Pt can no longer afford androgel.

## 2011-02-05 NOTE — Telephone Encounter (Signed)
I did a referral as above

## 2011-02-05 NOTE — Telephone Encounter (Signed)
That is fine but I do not prescribe these. We will refer him to Urology for this.

## 2011-02-06 ENCOUNTER — Telehealth: Payer: Self-pay | Admitting: Family Medicine

## 2011-02-06 DIAGNOSIS — E291 Testicular hypofunction: Secondary | ICD-10-CM

## 2011-02-06 NOTE — Telephone Encounter (Signed)
Pt went to pick up his Androgel. For a 58-month supply it is $1100.00. For whatever reason, [employer] won't pay, even with a prior auth (which I have not done) - this is what the pt says. At any rate, he wants to know if he can do the testosterone shots, and have his wife give them to him at home. Also states that at then end of this month or next, he will no longer have health insurance, so he is looking at a cheapter alternative - hence the idea about the shots. Please advise. Thanks .

## 2011-02-06 NOTE — Telephone Encounter (Signed)
Alejandro Hill, can you call the patient today and explain the referral? Thanks!

## 2011-02-06 NOTE — Telephone Encounter (Signed)
Tell him we will refer him to Urology for this. They can prescribe injections.

## 2011-02-07 NOTE — Telephone Encounter (Signed)
Referral was already done.  Pt is going to call [facility] back to schedule appt

## 2011-04-28 ENCOUNTER — Other Ambulatory Visit: Payer: Self-pay

## 2011-04-28 MED ORDER — LISINOPRIL-HYDROCHLOROTHIAZIDE 20-12.5 MG PO TABS: 1.0000 | ORAL_TABLET | Freq: Every day | ORAL | Status: DC

## 2011-05-27 ENCOUNTER — Other Ambulatory Visit: Payer: Self-pay | Admitting: Family Medicine

## 2011-06-02 ENCOUNTER — Ambulatory Visit
Admission: RE | Admit: 2011-06-02 | Discharge: 2011-06-02 | Disposition: A | Discharge status: Home / Self Care | Payer: BC Managed Care – PPO | Source: Ambulatory Visit | Attending: Pediatrics | Admitting: Pediatrics

## 2011-06-02 ENCOUNTER — Other Ambulatory Visit: Payer: Self-pay | Admitting: Pediatrics

## 2011-06-19 ENCOUNTER — Ambulatory Visit
Admission: RE | Admit: 2011-06-19 | Discharge: 2011-06-19 | Disposition: A | Discharge status: Home / Self Care | Payer: BC Managed Care – PPO | Source: Ambulatory Visit | Attending: Pediatrics | Admitting: Pediatrics

## 2011-06-19 ENCOUNTER — Other Ambulatory Visit: Payer: Self-pay | Admitting: Pediatrics

## 2011-06-19 DIAGNOSIS — J90 Pleural effusion, not elsewhere classified: Secondary | ICD-10-CM

## 2011-06-26 ENCOUNTER — Encounter: Payer: Self-pay | Admitting: Internal Medicine

## 2011-06-26 ENCOUNTER — Ambulatory Visit (INDEPENDENT_AMBULATORY_CARE_PROVIDER_SITE_OTHER): Payer: BC Managed Care – PPO | Admitting: Internal Medicine

## 2011-06-26 VITALS — BP 148/62 | HR 80 | Temp 98.6°F | Ht 66.0 in | Wt 217.0 lb

## 2011-06-26 DIAGNOSIS — J189 Pneumonia, unspecified organism: Secondary | ICD-10-CM

## 2011-06-26 DIAGNOSIS — J45909 Unspecified asthma, uncomplicated: Secondary | ICD-10-CM

## 2011-06-26 DIAGNOSIS — I10 Essential (primary) hypertension: Secondary | ICD-10-CM

## 2011-06-26 MED ORDER — OLMESARTAN MEDOXOMIL-HCTZ 20-12.5 MG PO TABS: 1.0000 | ORAL_TABLET | Freq: Every day | ORAL | Status: DC

## 2011-06-26 NOTE — Progress Notes (Signed)
  Subjective:    Patient ID: Alejandro Hill, male    DOB: [birth year], 56 y.o.   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 Cap.  06/26/2011 1st pulmonary eval / Abbeygail Igoe cc severe cough on acei and dry powder inhaler mostly dry 24 hours per day, gradually worse since November and refractory to multiple meds directed at allergy and asthma and then dx with ? pna > steadily downhill sob at rest and difficulty sleeping at rest, minimally better immediately p neb  No longer exp to cats effecive Dec 2012 > breathing no better  No purulent sputum, pleuritic cp or hemotpysis    Also denies any obvious fluctuation of symptoms with weather or environmental changes or other aggravating or alleviating factors except as outlined above       Review of Systems  Constitutional: Negative for fever, chills, activity change, appetite change and unexpected weight change.  HENT: Positive for sneezing. Negative for congestion, sore throat, rhinorrhea, trouble swallowing, dental problem, voice change and postnasal drip.   Eyes: Negative for visual disturbance.  Respiratory: Positive for cough and shortness of breath. Negative for choking.   Cardiovascular: Negative for chest pain and leg swelling.  Gastrointestinal: Negative for nausea, vomiting and abdominal pain.  Genitourinary: Negative for difficulty urinating.  Musculoskeletal: Negative for arthralgias.  Skin: Negative for rash.  Psychiatric/Behavioral: Negative for behavioral problems and  confusion.       Objective:   Physical Exam Anxious mod obese very hoarse amb wm with classic pseudowheeze who  ailed to answer a single question asked in a straightforward manner, tending to go off on tangents or answer questions with ambiguous medical terms or diagnoses and seemed perplexed  when asked the same question more than once for clarification   Wt 217 06/25/11 HEENT: nl dentition, turbinates, and orophanx. Nl external ear canals without cough reflex   NECK :  without JVD/Nodes/TM/ nl carotid upstrokes bilaterally   LUNGS: no acc muscle use, clear to A and P bilaterally without cough on insp or exp maneuvers   CV:  RRR  no s3 or murmur or increase in P2, no edema   ABD:  soft and nontender with nl excursion in the supine position. No bruits or organomegaly, bowel sounds nl  MS:  warm without deformities, calf tenderness, cyanosis or clubbing  SKIN: warm and dry without lesions    NEURO:  alert, approp, no deficits   cxr 06/19/11 Incomplete clearing of left lower lobe opacity most consistent with  pneumonia with probable small left effusion         Assessment & Plan:

## 2011-06-26 NOTE — Patient Instructions (Signed)
Stop lisinopril and stop fish oil and pulmicort and see if your nebulizer goes down over the next weeks   Start Benicar 40/25 one half daily  Try prilosec 20mg   Take 30-60 min before first meal of the day and Pepcid 20 mg one bedtime until cough is completely gone for at least a week without the need for cough suppression  I think of reflux and lisinopril  for chronic cough like I do oxygen for fire (doesn't cause the fire but once you get the oxygen suppressed it usually goes away regardless of the exact cause).   Please schedule a follow up office visit in 2 weeks, sooner if needed  With cxr

## 2011-06-27 NOTE — Assessment & Plan Note (Addendum)
Minimal residual atx in L base with ? Small effusion but this is of minimal importance. Placed in tickle file for recall cxr by 07/14/11

## 2011-06-27 NOTE — Assessment & Plan Note (Signed)
ACE inhibitors are problematic in  pts with airway complaints because  even experienced pulmonologists can't always distinguish ace effects from copd/asthma/pnds/ allergies etc.  By themselves they don't actually cause a problem, much like oxygen can't by itself start a fire, but they certainly serve as a powerful catalyst or enhancer for any "fire"  or inflammatory process in the upper airway, be it caused by an ET  tube or more commonly reflux (especially in the obese or pts with known GERD or who are on biphoshonates) or URI's, due to interference with bradykinin clearance.  The effects of acei on bradykinin levels occurs in 100% of pt's on acei (unless they surreptitiously stop the med!) but the classic cough is only reported in 5%.  This leaves 95% of pts on acei's  with a variety of syndromes including no identifiable symptom in most  vs non-specific symptoms that wax and wane depending on what other insult is occuring at the level of the upper airway.   Given his tendency to nonspecific upper airway symptoms I would avoid acei entirely here > given samples of benicar 40/25 one half daily

## 2011-07-08 ENCOUNTER — Encounter: Payer: Self-pay | Admitting: Family

## 2011-07-08 ENCOUNTER — Telehealth: Payer: Self-pay | Admitting: Internal Medicine

## 2011-07-08 ENCOUNTER — Ambulatory Visit (INDEPENDENT_AMBULATORY_CARE_PROVIDER_SITE_OTHER): Payer: BC Managed Care – PPO | Admitting: Family

## 2011-07-08 VITALS — BP 144/90 | Temp 98.9°F | Wt 222.0 lb

## 2011-07-08 DIAGNOSIS — H1045 Other chronic allergic conjunctivitis: Secondary | ICD-10-CM

## 2011-07-08 DIAGNOSIS — H1030 Unspecified acute conjunctivitis, unspecified eye: Secondary | ICD-10-CM

## 2011-07-08 DIAGNOSIS — H1010 Acute atopic conjunctivitis, unspecified eye: Secondary | ICD-10-CM

## 2011-07-08 MED ORDER — POLYMYXIN B-TRIMETHOPRIM 10000-0.1 UNIT/ML-% OP SOLN: 2.0000 [drp] | OPHTHALMIC | Status: AC

## 2011-07-08 NOTE — Patient Instructions (Addendum)
Allergic Conjunctivitis The conjunctiva is a thin membrane that covers the visible white part of the eyeball and the underside of the eyelids. This membrane protects and lubricates the eye. The membrane has small blood vessels running through it that can normally be seen. When the conjunctiva becomes inflamed, the condition is called conjunctivitis. In response to the inflammation, the conjunctival blood vessels become swollen. The swelling results in redness in the normally white part of the eye. The blood vessels of this membrane also react when a person has allergies and is then called allergic conjunctivitis. This condition usually lasts for as long as the allergy persists. Allergic conjunctivitis cannot be passed to another person (non-contagious). The likelihood of bacterial infection is great and the cause is not likely due to allergies if the inflamed eye has:  A sticky discharge.   Discharge or sticking together of the lids in the morning.   Scaling or flaking of the eyelids where the eyelashes come out.   Red swollen eyelids.  CAUSES   Viruses.   Irritants such as foreign bodies.   Chemicals.   General allergic reactions.   Inflammation or serious diseases in the inside or the outside of the eye or the orbit (the boney cavity in which the eye sits) can cause a "red eye."  SYMPTOMS   Eye redness.   Tearing.   Itchy eyes.   Burning feeling in the eyes.   Clear drainage from the eye.   Allergic reaction due to pollens or ragweed sensitivity. Seasonal allergic conjunctivitis is frequent in the spring when pollens are in the air and in the fall.  DIAGNOSIS  This condition, in its many forms, is usually diagnosed based on the history and an ophthalmological exam. It usually involves both eyes. If your eyes react at the same time every year, allergies may be the cause. While most "red eyes" are due to allergy or an infection, the role of an eye (ophthalmological) exam is  important. The exam can rule out serious diseases of the eye or orbit. TREATMENT   Non-antibiotic eye drops, ointments, or medications by mouth may be prescribed if the ophthalmologist is sure the conjunctivitis is due to allergies alone.   Over-the-counter drops and ointments for allergic symptoms should be used only after other causes of conjunctivitis have been ruled out, or as your caregiver suggests.  Medications by mouth are often prescribed if other allergy-related symptoms are present. If the ophthalmologist is sure that the conjunctivitis is due to allergies alone, treatment is normally limited to drops or ointments to reduce itching and burning. HOME CARE INSTRUCTIONS   Wash hands before and after applying drops or ointments, or touching the inflamed eye(s) or eyelids.   Do not let the eye dropper tip or ointment tube touch the eyelid when putting medicine in your eye.   Stop using your soft contact lenses and throw them away. Use a new pair of lenses when recovery is complete. You should run through sterilizing cycles at least three times before use after complete recovery if the old soft contact lenses are to be used. Hard contact lenses should be stopped. They need to be thoroughly sterilized before use after recovery.   Itching and burning eyes due to allergies is often relieved by using a cool cloth applied to closed eye(s).  SEEK MEDICAL CARE IF:   Your problems do not go away after two or three days of treatment.   Your lids are sticky (especially in the morning when   you wake up) or stick together.   Discharge develops. Antibiotics may be needed either as drops, ointment, or by mouth.   You have extreme light sensitivity.   An oral temperature above 102 F (38.9 C) develops.   Pain in or around the eye or any other visual symptom develops.  MAKE SURE YOU:   Understand these instructions.   Will watch your condition.   Will get help right away if you are not doing  well or get worse.  Document Released: 06/07/2002 Document Revised: 03/06/2011 Document Reviewed: 05/03/2007 [facility] Patient Information 2012 [place], [place].  Bacterial Conjunctivitis Conjunctivitis is an irritation (inflammation) of the clear membrane that covers the white part of the eye (conjunctiva). The irritation can also happen on the underside of the eyelids. Conjunctivitis makes the eye red or pink in color. This is what is commonly known as pink eye. CAUSES   Infection from a germ (bacteria) on the surface of the eye.   Infection from the irritation or injury of nearby tissues such as the eyelids or cornea.   More serious inflammation or infection on the inside of the eye.   Other eye diseases.   The use of certain eye medications.  SYMPTOMS  The normally white color of the eye or the underside of the eyelid is usually pink or red in color. The pink eye is usually associated with irritation, tearing and some sensitivity to light. Bacterial conjunctivitis is often associated with a thick, yellowish discharge from the eye. If a discharge is present, there may also be some blurred vision in the affected eye. DIAGNOSIS  Conjunctivitis is diagnosed by an eye exam. The eye specialist looks for changes in the surface tissues of the eye which take on changes that point to the specific type of conjunctivitis. A sample of any discharge may be collected on a Q-Tip (sterile swap). The sample will be sent to a lab to see whether or not the inflammation is caused by bacterial or viral infection. TREATMENT  Bacterial conjunctivitis is treated with medicines that kill germs (antibiotics). Drops are most often used. However, antibiotic ointments are available and may be preferred by some patients. Antibiotics by mouth (oral) are sometimes used. Artificial tears or eye washes may ease discomfort. HOME CARE INSTRUCTIONS   To ease discomfort, apply a cool, clean wash cloth to the eye for 10 to 20  minutes, 3 to 4 times a day.   Gently wipe away any drainage from the eye with a warm, wet washcloth or a cotton ball.   Wash your hands often with soap. Use paper towels to dry.   Do not share towels or wash cloths. This may spread the infection.   Change or wash your pillow case every day.   You should not use eye make-up until the infection is gone.   Do not operate machinery or drive if vision is blurred.   Stop using contacts lenses. Ask your eye professional how to sterilize or replace them before using again. This depends on the type of contact lenses used.   Do not touch the edge of the eyelid with the eye drop bottle or ointment tube when applying medications to the affected eye. This will stop you from spreading the infection to the other eye or to others. Do as your caregiver tell you.  SEEK IMMEDIATE MEDICAL CARE IF:   The infection has not improved within 3 days of beginning treatment.   A yellow discharge from the eye develops.  Pain in the eye increases.   The redness is spreading.   Vision becomes blurred.   An oral temperature above 102 F (38.9 C) develops, or as your caregiver suggests.   Facial pain, redness or swelling develops.   Any problems that may be related to the prescribed medicine develops.  MAKE SURE YOU:   Understand these instructions.   Will watch your condition.   Will get help right away if you are not doing well or get worse.  Document Released: 03/17/2005 Document Revised: 03/06/2011 Document Reviewed: 11/04/2007 [facility] Patient Information 2012 [place], [place].

## 2011-07-08 NOTE — Telephone Encounter (Signed)
Pt will swing back by here after his apt at 2:30.

## 2011-07-08 NOTE — Telephone Encounter (Signed)
Pt requesting sample of Benicar 40/25 to last until OV with MW on 07/18/11.  He is taking 1/2 tablet of this.  Pt was given 1 sample of Benicar 40/25 to last until OV on 07/18/11 with MW.  Nothing further needed at this time.

## 2011-07-09 ENCOUNTER — Encounter: Payer: Self-pay | Admitting: Family

## 2011-07-09 NOTE — Progress Notes (Signed)
Subjective:    Patient ID: Alejandro Hill, male    DOB: 19-Feb-1956, 56 y.o.   MRN: [id number]  HPI 56 year old white male, nonsmoker, patient of Dr. Clent Ridges is in today with complaints of itching, redness, matting and crusting that's been going on for 4 days. Over the last couple days, the matting and crusting has increased Can use an over-the-counter eye drops with no relief. He has a history of allergic rhinitis. Currently taking Singulair once a day. Denies any blurred vision, double vision, headaches, lightheadedness, dizziness.   Review of Systems  Constitutional: Negative.   HENT: Positive for congestion and sneezing.   Eyes: Positive for discharge, redness and itching. Negative for visual disturbance.  Respiratory: Negative.   Cardiovascular: Negative.   Musculoskeletal: Negative.   Skin: Negative.   Hematological: Negative.   Psychiatric/Behavioral: Negative.    Past Medical History  Diagnosis Date  . Migraine     cluster, [facility]  . Hyperlipidemia   . Hypertension   . Heart murmur     as a child  . Sleep apnea     saw Dr Fannie Knee  . Thyroid disease     hypothyroidism  . Asthma     exertional  . Diabetes mellitus     type II diabetes. Sees Dr Talmage Nap    History   Social History  . Marital Status: Married    Spouse Name: N/A    Number of Children: N/A  . Years of Education: N/A   Occupational History  . Not on file.   Social History Main Topics  . Smoking status: Never Smoker   . Smokeless tobacco: Never Used  . Alcohol Use: Yes     occ   . Drug Use: No  . Sexually Active: Not on file   Other Topics Concern  . Not on file   Social History Narrative   Regular Exercise:  YesReligion affecting care [facility]) so will not accept blood products.    Past Surgical History  Procedure Date  . Tonsillectomy   . Uvulopalatopharyngoplasty     per Dr Haroldine Laws  . Hernia repair 08/03/07    left inguinal herniorrhaphy. Dr Carolynne Edouard    . Colonoscopy 02-19-09    per Dr. Arlyce Dice, repeat in 10 yrs     Family History  Problem Relation Age of Onset  . Heart disease Other   . Hyperlipidemia Other   . Stroke Other     Allergies  Allergen Reactions  . Penicillins     REACTION: rash    Current Outpatient Prescriptions on File Prior to Visit  Medication Sig Dispense Refill  . albuterol (PROVENTIL HFA) 108 (90 BASE) MCG/ACT inhaler Inhale 2 puffs into the lungs every 6 (six) hours as needed.      Marland Kitchen albuterol (PROVENTIL) (2.5 MG/3ML) 0.083% nebulizer solution Take 2.5 mg by nebulization 4 (four) times daily.      . Cinnamon 500 MG TABS Take 1 tablet by mouth 2 (two) times daily.        . metFORMIN (GLUCOPHAGE) 500 MG tablet Take 2 tablets (1,000 mg total) by mouth 2 (two) times daily with a meal.  4 tablet  0  . montelukast (SINGULAIR) 10 MG tablet Take 10 mg by mouth at bedtime.      . Multiple Vitamins-Minerals (CENTRUM SILVER PO) Take 1 tablet by mouth daily.        Marland Kitchen olmesartan-hydrochlorothiazide (BENICAR HCT) 20-12.5 MG per tablet Take 1 tablet by mouth  daily.      . pravastatin (PRAVACHOL) 40 MG tablet Take 1 tablet (40 mg total) by mouth daily.  90 tablet  3  . UNABLE TO FIND Med Name: axeron 1 pump under each arm daily      . verapamil (CALAN-SR) 240 MG CR tablet Take 1 tablet (240 mg total) by mouth daily.  90 tablet  3    BP 144/90  Temp(Src) 98.9 F (37.2 C) (Oral)  Wt 222 lb (100.699 kg)chart    Objective:   Physical Exam  Constitutional: He is oriented to person, place, and time. He appears well-developed and well-nourished.  Eyes: Pupils are equal, round, and reactive to light. Right eye exhibits discharge.       Both eyes with red and injected. Minimal crusting noted to the lower lid.  Neck: Normal range of motion. Neck supple.  Cardiovascular: Normal rate, regular rhythm and normal heart sounds.   Pulmonary/Chest: Effort normal and breath sounds normal.  Neurological: He is alert and oriented to  person, place, and time.  Skin: Skin is warm and dry.  Psychiatric: He has a normal mood and affect.          Assessment & Plan:  Assessment: Conjunctivitis, initially allergic with bacterial component  Plan: Zyrtec over-the-counter once a day. Polytrim ophthalmic drops 2 drops every 6 hours in both eyes. Strict handwashing. Patient to call the office if his symptoms worsen or persist, return as scheduled or when necessary.

## 2011-07-14 ENCOUNTER — Ambulatory Visit: Payer: BC Managed Care – PPO | Admitting: Internal Medicine

## 2011-07-18 ENCOUNTER — Encounter: Payer: Self-pay | Admitting: Internal Medicine

## 2011-07-18 ENCOUNTER — Ambulatory Visit (INDEPENDENT_AMBULATORY_CARE_PROVIDER_SITE_OTHER): Payer: BC Managed Care – PPO | Admitting: Internal Medicine

## 2011-07-18 ENCOUNTER — Ambulatory Visit (INDEPENDENT_AMBULATORY_CARE_PROVIDER_SITE_OTHER)
Admission: RE | Admit: 2011-07-18 | Discharge: 2011-07-18 | Disposition: A | Discharge status: Home / Self Care | Payer: BC Managed Care – PPO | Source: Ambulatory Visit | Attending: Internal Medicine | Admitting: Internal Medicine

## 2011-07-18 VITALS — BP 122/78 | HR 69 | Temp 98.4°F | Ht 66.0 in | Wt 223.6 lb

## 2011-07-18 DIAGNOSIS — J45909 Unspecified asthma, uncomplicated: Secondary | ICD-10-CM

## 2011-07-18 DIAGNOSIS — I10 Essential (primary) hypertension: Secondary | ICD-10-CM

## 2011-07-18 DIAGNOSIS — J189 Pneumonia, unspecified organism: Secondary | ICD-10-CM

## 2011-07-18 MED ORDER — OLMESARTAN MEDOXOMIL-HCTZ 20-12.5 MG PO TABS: 1.0000 | ORAL_TABLET | Freq: Every day | ORAL | Status: DC

## 2011-07-18 NOTE — Assessment & Plan Note (Signed)
Dramatic convincing improvement off acei > should not be rechallenged  All goals of chronic asthma control met including optimal function and elimination of symptoms with minimal need for rescue therapy vs baseline - no need for ICS at present   Contingencies discussed in full including contacting this office immediately if not controlling the symptoms using the rule of two's.

## 2011-07-18 NOTE — Progress Notes (Signed)
Subjective:    Patient ID: Alejandro Hill, male    DOB: 17-Jun-1955    MRN: [id number]  HPI  54 yowm never smoker never problems as child worked in [employer], [employer] in [place] for 7 years until 1982 with no resp problems until onset of nasal symptoms early 2012 eval by [place] who rec he quit work effective early Dec 2012 due to exposure to cats at work and refractory allergy and asthma symptoms with improved nasal symptoms but breathing problems which started in November 2012 while on acei have not improved and therefore referred by [facility] 06/26/2011 to pulmonary clinic with abn cxr p ? Cap.  06/26/2011 1st pulmonary eval / Tennyson Wacha cc severe cough on acei and dry powder inhaler mostly dry 24 hours per day, gradually worse since November and refractory to multiple meds directed at allergy and asthma and then dx with ? pna > steadily downhill sob at rest and difficulty sleeping at rest, minimally better immediately p neb  No longer exp to cats effecive Dec 2012 > breathing no better  No purulent sputum, pleuritic cp or hemotpysis rec Stop lisinopril and stop fish oil and pulmicort and see if your nebulizer goes down over the next weeks   Start Benicar 40/25 one half daily  Try prilosec 20mg   Take 30-60 min before first meal of the day and Pepcid 20 mg one bedtime until cough is completely gone for at least a week without the need for cough suppression   07/18/2011 f/u ov/Jeffren Dombek cc much better, no need at all for the neb, using proventil 3-4 week at most, off acei and tolerating arb well. No cough  Sleeping ok without nocturnal  or early am exacerbation  of respiratory  c/o's or need for noct saba. Also denies any obvious fluctuation of symptoms with weather or environmental changes or other aggravating or alleviating factors except as outlined above.  ROS  At present neg for  any significant sore throat, dysphagia, dental problems, itching, sneezing,  nasal congestion or excess/ purulent secretions,  ear ache,   fever, chills, sweats, unintended wt loss, pleuritic or exertional cp, hemoptysis, palpitations, orthopnea pnd or leg swelling.  Also denies presyncope, palpitations, heartburn, abdominal pain, anorexia, nausea, vomiting, diarrhea  or change in bowel or urinary habits, change in stools or urine, dysuria,hematuria,  rash, arthralgias, visual complaints, headache, numbness weakness or ataxia or problems with walking or coordination. No noted change in mood/affect or memory.                               Objective:   Physical Exam  Obese wm nad much less hoarse  Wt 217 06/25/11 > 223 07/18/2011   HEENT: nl dentition, turbinates, and orophanx. Nl external ear canals without cough reflex   NECK :  without JVD/Nodes/TM/ nl carotid upstrokes bilaterally   LUNGS: no acc muscle use, clear to A and P bilaterally without cough on insp or exp maneuvers   CV:  RRR  no s3 or murmur or increase in P2, no edema   ABD:  soft and nontender with nl excursion in the supine position. No bruits or organomegaly, bowel sounds nl  MS:  warm without deformities, calf tenderness, cyanosis or clubbing      CXR  07/18/2011 :  Persistent mild bibasilar airspace disease, likely reflects atelectasis. Given the incomplete clearing, CT of the chest with contrast would be useful to ensure no proximal obstructing lesion.  2. Low lung volumes          Assessment & Plan:

## 2011-07-18 NOTE — Patient Instructions (Addendum)
Please schedule a follow up office visit in 4 weeks, sooner if needed with pft's  Continue on benicar 20 -12.5 one daily but you should avoid ace inhibitors indefinitely  Ok to try off the acid suppression and see if symptoms flare  Late ADD Needs cxr and 02 sat walking on return due to radiology concern re basilar atx

## 2011-07-18 NOTE — Assessment & Plan Note (Signed)
Ok off ACEI > continue benicar

## 2011-07-18 NOTE — Assessment & Plan Note (Signed)
Overall marked improvement aeration with no effusion though radiology still concerned some bilateral SSA > f/u with cxr in 4 weeks

## 2011-07-21 ENCOUNTER — Telehealth: Payer: Self-pay | Admitting: Internal Medicine

## 2011-07-21 NOTE — Telephone Encounter (Signed)
ATC Angel at Dr. Hilario Quarry office but they are closed for the day.  [facility] tomorrow

## 2011-07-22 ENCOUNTER — Telehealth: Payer: Self-pay | Admitting: Internal Medicine

## 2011-07-22 MED ORDER — OLMESARTAN MEDOXOMIL-HCTZ 20-12.5 MG PO TABS: 1.0000 | ORAL_TABLET | Freq: Every day | ORAL | Status: DC

## 2011-07-22 NOTE — Telephone Encounter (Signed)
I spoke with Alejandro Hill since Alejandro Hill was in a room. She wanted to know if MW had added anything new to Alejandro Hill medication so they can have for there records. I advised them MW advised Alejandro Hill to continue on the benicare 20-12.5 QD. She voiced her understanding and needed nothing further

## 2011-07-22 NOTE — Telephone Encounter (Signed)
I spoke with pt and he is requesting a 90 day supply of benicar sent bc he had misplaced his printed rx MW gave him. rx has been sent in and nothing further was needed

## 2011-07-23 NOTE — Progress Notes (Signed)
Quick Note:  Spoke with pt and notified of results per Dr. Wert. Pt verbalized understanding and denied any questions.  ______ 

## 2011-07-24 ENCOUNTER — Telehealth: Payer: Self-pay | Admitting: *Deleted

## 2011-07-24 NOTE — Telephone Encounter (Signed)
Called to initiate PA for benicar hct 20-12.5 Member ID #: [id number] [employer] is going to fax over PA form for MW to fill out and fax back. This will be faxed to triage fax #. Will forward to leslie to look for this since im not doing PA's this afternoon

## 2011-07-24 NOTE — Telephone Encounter (Signed)
Form received and placed in MW look at cabinet.

## 2011-07-25 MED ORDER — VALSARTAN-HYDROCHLOROTHIAZIDE 160-12.5 MG PO TABS: 1.0000 | ORAL_TABLET | Freq: Every day | ORAL | Status: DC

## 2011-07-25 NOTE — Telephone Encounter (Signed)
Spoke with pt and notified of med change. Pt verbalized understanding and rx was sent to pharm.

## 2011-07-25 NOTE — Telephone Encounter (Signed)
Per MW- ok to change benicar to valsartan 160/12.5 1 po qd. ATC the pt to inform him. Had to [facility].

## 2011-08-19 ENCOUNTER — Ambulatory Visit (INDEPENDENT_AMBULATORY_CARE_PROVIDER_SITE_OTHER): Payer: BC Managed Care – PPO | Admitting: Internal Medicine

## 2011-08-19 ENCOUNTER — Ambulatory Visit (INDEPENDENT_AMBULATORY_CARE_PROVIDER_SITE_OTHER)
Admission: RE | Admit: 2011-08-19 | Discharge: 2011-08-19 | Disposition: A | Discharge status: Home / Self Care | Payer: BC Managed Care – PPO | Source: Ambulatory Visit | Attending: Internal Medicine | Admitting: Internal Medicine

## 2011-08-19 ENCOUNTER — Encounter: Payer: Self-pay | Admitting: Internal Medicine

## 2011-08-19 VITALS — BP 142/64 | HR 78 | Temp 98.7°F | Ht 66.0 in | Wt 217.0 lb

## 2011-08-19 DIAGNOSIS — R0600 Dyspnea, unspecified: Secondary | ICD-10-CM

## 2011-08-19 DIAGNOSIS — J45909 Unspecified asthma, uncomplicated: Secondary | ICD-10-CM

## 2011-08-19 DIAGNOSIS — R05 Cough: Secondary | ICD-10-CM

## 2011-08-19 DIAGNOSIS — R0609 Other forms of dyspnea: Secondary | ICD-10-CM

## 2011-08-19 DIAGNOSIS — I10 Essential (primary) hypertension: Secondary | ICD-10-CM

## 2011-08-19 DIAGNOSIS — J189 Pneumonia, unspecified organism: Secondary | ICD-10-CM

## 2011-08-19 DIAGNOSIS — R059 Cough, unspecified: Secondary | ICD-10-CM

## 2011-08-19 DIAGNOSIS — R0989 Other specified symptoms and signs involving the circulatory and respiratory systems: Secondary | ICD-10-CM

## 2011-08-19 LAB — PULMONARY FUNCTION TEST

## 2011-08-19 NOTE — Assessment & Plan Note (Addendum)
Off acei 06/26/2011 > symptoms resolved off ICS and saba down to 4 x per week  PFTs wnl 08/19/2011   Not clear there's any asthma presesent at all at this point but if there is it's being well controlled with singulair and rare need for saba  F/u can be prn / [place] following allergies

## 2011-08-19 NOTE — Progress Notes (Signed)
PFT done today. 

## 2011-08-19 NOTE — Progress Notes (Signed)
Subjective:    Patient ID: Alejandro Hill, male    DOB: [birth year]    MRN: [id number]  HPI  64 yowm never smoker never problems as child worked in [employer], [employer] in [place] for 7 years until 1982 with no resp problems until onset of nasal symptoms early 2012 eval by [place] who rec he quit work effective early Dec 2012 due to exposure to cats at work and refractory allergy and asthma symptoms with improved nasal symptoms but breathing problems which started in November 2012 while on acei have not improved and therefore referred by [facility] 06/26/2011 to pulmonary clinic with abn cxr p ? Cap.  06/26/2011 1st pulmonary eval / Alejandro Hill cc severe cough on acei and dry powder inhaler mostly dry 24 hours per day, gradually worse since November and refractory to multiple meds directed at allergy and asthma and then dx with ? pna > steadily downhill sob at rest and difficulty sleeping at rest, minimally better immediately p neb  No longer exp to cats effecive Dec 2012 > breathing no better  No purulent sputum, pleuritic cp or hemotpysis rec Stop lisinopril and stop fish oil and pulmicort and see if your nebulizer goes down over the next weeks   Start Benicar 40/25 one half daily  Try prilosec 20mg   Take 30-60 min before first meal of the day and Pepcid 20 mg one bedtime until cough is completely gone for at least a week without the need for cough suppression   07/18/2011 f/u ov/Alejandro Hill cc much better, no need at all for the neb, using proventil 3-4 week at most, off acei and tolerating arb well. No cough rec Please schedule a follow up office visit in 4 weeks, sooner if needed with pft's Continue on benicar 20 -12.5 one daily but you should avoid ace inhibitors indefinitely Ok to try off the acid suppression and see if symptoms flare     08/19/2011 f/u ov/Alejandro Hill cc cough completely gone and allergies have cleared up to his satisfaction and only using singulair but sob with rapid walk x 15 min and while  chopping and stacking wood then developed positional L cp x one episode  p sat down in evening,  painful to lie on L side;  Rare need for saba now.  Sleeping ok without nocturnal  or early am exacerbation  of respiratory  c/o's or need for noct saba. Also denies any obvious fluctuation of symptoms with weather or environmental changes or other aggravating or alleviating factors except as outlined above.  ROS  At present neg for  any significant sore throat, dysphagia, dental problems, itching, sneezing,  nasal congestion or excess/ purulent secretions, ear ache,   fever, chills, sweats, unintended wt loss, pleuritic or exertional cp, hemoptysis, palpitations, orthopnea pnd or leg swelling.  Also denies presyncope, palpitations, heartburn, abdominal pain, anorexia, nausea, vomiting, diarrhea  or change in bowel or urinary habits, change in stools or urine, dysuria,hematuria,  rash, arthralgias, visual complaints, headache, numbness weakness or ataxia or problems with walking or coordination. No noted change in mood/affect or memory.          Objective:   Physical Exam  Obese wm nad    Wt 217 06/25/11 > 223 07/18/2011 > 08/19/2011  217  HEENT: nl dentition, turbinates, and orophanx. Nl external ear canals without cough reflex   NECK :  without JVD/Nodes/TM/ nl carotid upstrokes bilaterally   LUNGS: no acc muscle use, clear to A and P bilaterally without cough on insp or  exp maneuvers   CV:  RRR  no s3 or murmur or increase in P2, no edema   ABD:  soft and nontender with nl excursion in the supine position. No bruits or organomegaly, bowel sounds nl  MS:  warm without deformities, calf tenderness, cyanosis or clubbing      CXR  08/19/2011 :   No radiographic evidence of acute cardiopulmonary disease.           Assessment & Plan:

## 2011-08-19 NOTE — Assessment & Plan Note (Signed)
Classic Upper airway cough syndrome, so named because it's frequently impossible to sort out how much is  CR/sinusitis with freq throat clearing (which can be related to primary GERD)   vs  causing  secondary (" extra esophageal")  GERD from wide swings in gastric pressure that occur with throat clearing, often  promoting self use of mint and menthol lozenges that reduce the lower esophageal sphincter tone and exacerbate the problem further in a cyclical fashion.   These are the same pts (now being labeled as having "irritable larynx syndrome" by some cough centers) who not infrequently have a history of having failed to tolerate ace inhibitors,  dry powder inhalers or biphosphonates or report having atypical reflux symptoms that don't respond to standard doses of PPI , and are easily confused as having aecopd or asthma flares by even experienced allergists/ pulmonologists.  Clearly responded to d/c acei, no further w/u or f/u needed

## 2011-08-19 NOTE — Assessment & Plan Note (Signed)
Try off acei 06/27/2011 due to pseudoasthma > completely resolved 08/19/2011   Doing much better off ACE with good bp control but defer longterm rx back to Dr Clent Ridges

## 2011-08-19 NOTE — Assessment & Plan Note (Signed)
CXR nl, no other f/u needed

## 2011-08-19 NOTE — Progress Notes (Signed)
Quick Note:  Spoke with pt and notified of results per Dr. Wert. Pt verbalized understanding and denied any questions.  ______ 

## 2011-08-19 NOTE — Patient Instructions (Addendum)
Please remember to go to the  x-ray department downstairs for your tests - we will call you with the results when they are available.  Weight control is simply a matter of calorie balance which needs to be tilted in your favor by eating less and exercising more.  To get the most out of exercise, you need to be continuously aware that you are short of breath, but never out of breath, for 30 minutes daily. As you improve, it will actually be easier for you to do the same amount of exercise  in  30 minutes so always push to the level where you are short of breath.  If this does not result in gradual weight reduction then I strongly recommend you see a nutritionist with a food diary x 2 weeks so that we can work out a negative calorie balance which is universally effective in steady weight loss programs.  Think of your calorie balance like you do your bank account where in this case you want the balance to go down so you must take in less calories than you burn up.  It's just that simple:  Hard to do, but easy to understand.  Good luck!

## 2011-08-19 NOTE — Assessment & Plan Note (Addendum)
-   PFT's wnl 08/19/2011 x ERV 46%  I had an extended discussion with the patient today lasting 15 to 20 minutes of a 25 minute visit on the following issues:   Only occurs now with heavy ex like yard work or rapid walk x 15 min completely explained by obesity/ deconditioning and no further w/u needed

## 2011-08-26 ENCOUNTER — Encounter: Payer: Self-pay | Admitting: Internal Medicine

## 2011-09-01 ENCOUNTER — Telehealth: Payer: Self-pay | Admitting: Internal Medicine

## 2011-09-01 NOTE — Telephone Encounter (Signed)
I strongly rec now (as I did at ov) that he let Dr Clent Ridges review with him the hundreds of choices for hbp that fit him best in terms of his insurance vs paying cash at a discount for a year's supply (need to be sure what works best before committing to a year of it and I can't be sure this over the phone, nor can anyone else).

## 2011-09-01 NOTE — Telephone Encounter (Signed)
Called and spoke with pt and he stated that he is wanting to change from valsartan hct to another med that MW rec for the pt.  He is wanting this med sent to [facility] for 1 year supply.  Pt stated that the pharmacy is just waiting on the rx from [place].  MW please advise. Thanks  Allergies  Allergen Reactions  . Penicillins     REACTION: rash

## 2011-09-02 NOTE — Telephone Encounter (Signed)
Spoke with pt and notified of recs per MW. He verbalized understanding. States will make appt with Dr. Clent Ridges. Nothing further needed.

## 2011-09-05 ENCOUNTER — Telehealth: Payer: Self-pay | Admitting: Family Medicine

## 2011-09-05 NOTE — Telephone Encounter (Signed)
Pt saw Dr. Sherene Sires and changed BP medication. Pt stating that Dr. Sherene Sires told him to contact his PCP for a Refill. Pt is not sure what it was changed to. But does not have insurance at this time and would like the medication be sent to [facility] so he can get a 12 month supply for 75.00. (see phone note from Dr. Sherene Sires office on 09/01/11). Please contact pt  [facility]

## 2011-09-05 NOTE — Telephone Encounter (Signed)
I spoke with pt and he is aware that you are out of the office today. He said that he can get the Losartan/HCTZ ( 12 months at one time 360 pills ). Can we do this? Please call pt.

## 2011-09-08 ENCOUNTER — Ambulatory Visit (INDEPENDENT_AMBULATORY_CARE_PROVIDER_SITE_OTHER): Payer: BC Managed Care – PPO | Admitting: Family Medicine

## 2011-09-08 ENCOUNTER — Encounter: Payer: Self-pay | Admitting: Family Medicine

## 2011-09-08 VITALS — BP 138/90 | HR 86 | Temp 98.9°F | Wt 218.0 lb

## 2011-09-08 DIAGNOSIS — J45909 Unspecified asthma, uncomplicated: Secondary | ICD-10-CM

## 2011-09-08 DIAGNOSIS — I10 Essential (primary) hypertension: Secondary | ICD-10-CM

## 2011-09-08 NOTE — Telephone Encounter (Signed)
I spoke with pt and he will give Dr. Thurston Hole office another call and ask if he can write the prescription.

## 2011-09-08 NOTE — Progress Notes (Signed)
  Subjective:    Patient ID: Alejandro Hill, male    DOB: 1955/10/05, 56 y.o.   MRN: [id number]  HPI Here to follow up on HTN. Dr. Sherene Sires has determined that he cannot tolerate ACE inhibitors, so he tried him on samples of Benicar HCT. This worked quite well, he felt fine and his BP was well controled. For long term purposes, he was advised to try either Losartan or Valsartan since these are generic. He has found a way to get a 12 month supply of Losartan HCT for $70 and asks me for a rx. However I see that his systolic BP was not as well controlled on Valsartan as it was on Benicar. Kolt tells me he was forced to quit his job on 03-14-11 due to cat exposure worsening his asthma. He is still looking for another job.    Review of Systems  Constitutional: Negative.   Respiratory: Negative.   Cardiovascular: Negative.        Objective:   Physical Exam  Constitutional: He appears well-developed and well-nourished.  Cardiovascular: Normal rate, regular rhythm, normal heart sounds and intact distal pulses.   Pulmonary/Chest: Effort normal and breath sounds normal.          Assessment & Plan:  His BP was well controlled on Benicar HCT, and we have plenty of samples of this. We will stay on this, and I supplied him with a full year's worth of samples. Recheck in 6 months

## 2011-09-08 NOTE — Telephone Encounter (Signed)
This med was changed in April. He needs an OV with me to see if it is working properly, then we can talk about refills. I realize he has no insurance but this does not mean we should not take care of him

## 2011-10-21 ENCOUNTER — Ambulatory Visit (INDEPENDENT_AMBULATORY_CARE_PROVIDER_SITE_OTHER): Payer: BC Managed Care – PPO | Admitting: Family Medicine

## 2011-10-21 ENCOUNTER — Encounter: Payer: Self-pay | Admitting: Family Medicine

## 2011-10-21 VITALS — BP 130/80 | HR 96 | Temp 98.5°F | Wt 219.0 lb

## 2011-10-21 DIAGNOSIS — J45909 Unspecified asthma, uncomplicated: Secondary | ICD-10-CM

## 2011-10-21 DIAGNOSIS — I10 Essential (primary) hypertension: Secondary | ICD-10-CM

## 2011-10-21 DIAGNOSIS — H109 Unspecified conjunctivitis: Secondary | ICD-10-CM

## 2011-10-21 MED ORDER — NEOMYCIN-POLYMYXIN-HC OP SUSP: 2.0000 [drp] | Freq: Four times a day (QID) | OPHTHALMIC | Status: DC

## 2011-10-21 MED ORDER — CICLESONIDE 160 MCG/ACT IN AERS: 2.0000 | INHALATION_SPRAY | Freq: Two times a day (BID) | RESPIRATORY_TRACT | Status: DC

## 2011-10-21 NOTE — Progress Notes (Signed)
  Subjective:    Patient ID: Alejandro Hill, male    DOB: 07-09-1955, 56 y.o.   MRN: [id number]  HPI Here to follow up numerous issues. He has continued to have frequent bouts of SOB, wheezing, and a dry cough for 6 months or so. He has been seeing Dr. Sherene Sires who got him off all ACE inhibitors. This has helped, but he still has daily bouts of cough and SOB. He uses his rescue inhaler 3-4 times a day. His BP has been borderline high. Also for the past week his left eye has been red, burning, and has some yellow DC in it. Vision is unchanged.    Review of Systems  Constitutional: Negative.   Eyes: Positive for pain, discharge, redness and itching. Negative for photophobia and visual disturbance.  Respiratory: Positive for cough, chest tightness, shortness of breath and wheezing.   Cardiovascular: Negative.        Objective:   Physical Exam  Constitutional: He appears well-developed and well-nourished.  HENT:       Left conjunctiva is red   Eyes: Conjunctivae are normal.  Neck: Neck supple. No thyromegaly present.  Cardiovascular: Normal rate, regular rhythm, normal heart sounds and intact distal pulses.   Pulmonary/Chest: Effort normal. No respiratory distress. He has no rales.       Soft expiratory wheezes  Abdominal: Soft. Bowel sounds are normal. He exhibits no distension and no mass. There is no tenderness. There is no rebound and no guarding.  Lymphadenopathy:    He has no cervical adenopathy.          Assessment & Plan:  He does seem to have some underlying asthma, and he would benefit from a daily maintenance steroid inhaler. I gave him samples to try Alvesco 160 bid for several weeks, and he can use the albuterol prn. He will set up a cpx in 2 weeks, and we can follow up at that time. His HTN is stable. Use Cortisporin eye suspension for the pinkeye.

## 2011-10-23 ENCOUNTER — Other Ambulatory Visit (INDEPENDENT_AMBULATORY_CARE_PROVIDER_SITE_OTHER): Payer: BC Managed Care – PPO

## 2011-10-23 DIAGNOSIS — Z0000 Encounter for general adult medical examination without abnormal findings: Secondary | ICD-10-CM

## 2011-10-23 LAB — BASIC METABOLIC PANEL
GFR: 93.25 mL/min (ref >60.00)
Potassium: 3.8 mEq/L (ref 3.5–5.1)
Sodium: 137 mEq/L (ref 135–145)

## 2011-10-23 LAB — HEPATIC FUNCTION PANEL
ALT: 25 U/L (ref 0–53)
AST: 23 U/L (ref 0–37)
Alkaline Phosphatase: 70 U/L (ref 39–117)
Total Bilirubin: 0.9 mg/dL (ref 0.3–1.2)

## 2011-10-23 LAB — POCT URINALYSIS DIPSTICK
Bilirubin, UA: NEGATIVE
Glucose, UA: NEGATIVE
Leukocytes, UA: NEGATIVE
Nitrite, UA: NEGATIVE

## 2011-10-23 LAB — TSH: TSH: 1.29 u[IU]/mL (ref 0.35–5.50)

## 2011-10-23 LAB — CBC WITH DIFFERENTIAL/PLATELET
Eosinophils Relative: 1.1 % (ref 0.0–5.0)
HCT: 47.5 % (ref 39.0–52.0)
Hemoglobin: 16.1 g/dL (ref 13.0–17.0)
Lymphocytes Relative: 13.6 % (ref 12.0–46.0)
Lymphs Abs: 1 10*3/uL (ref 0.7–4.0)
Monocytes Relative: 7.4 % (ref 3.0–12.0)
Neutro Abs: 5.7 10*3/uL (ref 1.4–7.7)
Platelets: 211 10*3/uL (ref 150.0–400.0)
WBC: 7.3 10*3/uL (ref 4.5–10.5)

## 2011-10-23 LAB — LIPID PANEL
Total CHOL/HDL Ratio: 4
VLDL: 35.8 mg/dL (ref 0.0–40.0)

## 2011-10-24 ENCOUNTER — Other Ambulatory Visit: Payer: BC Managed Care – PPO

## 2011-10-28 ENCOUNTER — Ambulatory Visit (INDEPENDENT_AMBULATORY_CARE_PROVIDER_SITE_OTHER): Payer: BC Managed Care – PPO | Admitting: Family Medicine

## 2011-10-28 ENCOUNTER — Encounter: Payer: Self-pay | Admitting: Family Medicine

## 2011-10-28 ENCOUNTER — Ambulatory Visit: Payer: BC Managed Care – PPO | Admitting: Family Medicine

## 2011-10-28 VITALS — BP 134/90 | HR 77 | Temp 98.7°F | Ht 64.75 in | Wt 215.0 lb

## 2011-10-28 DIAGNOSIS — IMO0001 Reserved for inherently not codable concepts without codable children: Secondary | ICD-10-CM

## 2011-10-28 DIAGNOSIS — M791 Myalgia, unspecified site: Secondary | ICD-10-CM

## 2011-10-28 DIAGNOSIS — Z0000 Encounter for general adult medical examination without abnormal findings: Secondary | ICD-10-CM

## 2011-10-28 LAB — CK: Total CK: 112 U/L (ref 7–232)

## 2011-10-28 LAB — SEDIMENTATION RATE: Sed Rate: 7 mm/hr (ref 0–22)

## 2011-10-28 MED ORDER — ALBUTEROL SULFATE HFA 108 (90 BASE) MCG/ACT IN AERS: 2.0000 | INHALATION_SPRAY | RESPIRATORY_TRACT | Status: DC | PRN

## 2011-10-28 MED ORDER — FLUOXETINE HCL 20 MG PO TABS: 20.0000 mg | ORAL_TABLET | Freq: Every day | ORAL | Status: DC

## 2011-10-28 MED ORDER — METFORMIN HCL 500 MG PO TABS: 500.0000 mg | ORAL_TABLET | Freq: Two times a day (BID) | ORAL | Status: DC

## 2011-10-28 MED ORDER — CICLESONIDE 160 MCG/ACT IN AERS: 2.0000 | INHALATION_SPRAY | Freq: Two times a day (BID) | RESPIRATORY_TRACT | Status: DC

## 2011-10-28 MED ORDER — MONTELUKAST SODIUM 10 MG PO TABS: 10.0000 mg | ORAL_TABLET | ORAL | Status: DC | PRN

## 2011-10-28 MED ORDER — VALSARTAN-HYDROCHLOROTHIAZIDE 160-12.5 MG PO TABS: 1.0000 | ORAL_TABLET | Freq: Every day | ORAL | Status: DC

## 2011-10-28 MED ORDER — VERAPAMIL HCL ER 240 MG PO TBCR: 240.0000 mg | EXTENDED_RELEASE_TABLET | Freq: Every day | ORAL | Status: DC

## 2011-10-28 MED ORDER — PRAVASTATIN SODIUM 40 MG PO TABS: 40.0000 mg | ORAL_TABLET | Freq: Every day | ORAL | Status: DC

## 2011-10-28 NOTE — Progress Notes (Signed)
  Subjective:    Patient ID: Alejandro Hill, male    DOB: Jul 25, 1955, 56 y.o.   MRN: [id number]  HPI 56 yr old male for a cpx. We saw him one week ago for an asthma flare and let him try Alvesco inhalers bid. He is pleased with the results and wants to stay on this. He does complain of diffuse muscle aches which are worse in the mornings and ease up later in the day. No joint swelling. He has also been dealing with a lot of stress, primarily of a financial nature. He cannot find work and their finances are very tight. He worries all the time.   Review of Systems  Constitutional: Negative.   HENT: Negative.   Eyes: Negative.   Respiratory: Negative.   Cardiovascular: Negative.   Gastrointestinal: Negative.   Genitourinary: Negative.   Musculoskeletal: Negative.   Skin: Negative.   Neurological: Negative.   Hematological: Negative.   Psychiatric/Behavioral: Negative.        Objective:   Physical Exam  Constitutional: He is oriented to person, place, and time. He appears well-developed and well-nourished. No distress.  HENT:  Head: Normocephalic and atraumatic.  Right Ear: External ear normal.  Left Ear: External ear normal.  Nose: Nose normal.  Mouth/Throat: Oropharynx is clear and moist. No oropharyngeal exudate.  Eyes: Conjunctivae and EOM are normal. Pupils are equal, round, and reactive to light. Right eye exhibits no discharge. Left eye exhibits no discharge. No scleral icterus.  Neck: Neck supple. No JVD present. No tracheal deviation present. No thyromegaly present.  Cardiovascular: Normal rate, regular rhythm, normal heart sounds and intact distal pulses.  Exam reveals no gallop and no friction rub.   No murmur heard.      EKG normal   Pulmonary/Chest: Effort normal and breath sounds normal. No respiratory distress. He has no wheezes. He has no rales. He exhibits no tenderness.  Abdominal: Soft. Bowel sounds are normal. He exhibits no distension and no mass. There is no  tenderness. There is no rebound and no guarding.  Genitourinary: Rectum normal, prostate normal and penis normal. Guaiac negative stool. No penile tenderness.  Musculoskeletal: Normal range of motion. He exhibits no edema and no tenderness.  Lymphadenopathy:    He has no cervical adenopathy.  Neurological: He is alert and oriented to person, place, and time. He has normal reflexes. No cranial nerve deficit. He exhibits normal muscle tone. Coordination normal.  Skin: Skin is warm and dry. No rash noted. He is not diaphoretic. No erythema. No pallor.  Psychiatric: He has a normal mood and affect. His behavior is normal. Judgment and thought content normal.          Assessment & Plan:  Well exam. Watch the diet and lose some weight. Get more labs today. Try Prozac 20 mg a day.

## 2011-10-29 LAB — RHEUMATOID FACTOR: Rhuematoid fact SerPl-aCnc: <10 IU/mL (ref <=14)

## 2011-11-03 NOTE — Progress Notes (Signed)
Quick Note:  I spoke with pt ______ 

## 2011-11-23 ENCOUNTER — Other Ambulatory Visit: Payer: Self-pay | Admitting: Family Medicine

## 2011-12-15 ENCOUNTER — Other Ambulatory Visit: Payer: Self-pay | Admitting: Family Medicine

## 2011-12-19 ENCOUNTER — Telehealth: Payer: Self-pay | Admitting: Family Medicine

## 2011-12-19 DIAGNOSIS — E291 Testicular hypofunction: Secondary | ICD-10-CM

## 2011-12-19 NOTE — Telephone Encounter (Signed)
I spoke with pt and he does have refills on the inhaler. He is requesting something stronger for the anxiety and wants to know if we can order the testosterone pellets to inject or the serum. His insurance will only cover a certain amount of doctor visits per year.

## 2011-12-19 NOTE — Telephone Encounter (Signed)
Caller: Alejandro Hill/Patient; Phone: [phone]; Reason for Call: Patient request to speak with Dr.  Clent Ridges regarding 3 medications he is taking.  The medication Alvesco inhaler he needs to get a prescription, the 2nd medication is for aniexty is helping a little but thinks need something stronger.  The last medication is for testosterone, has questions about doing something different do to insurance.  Please call him back.  Thanks

## 2011-12-22 NOTE — Telephone Encounter (Signed)
I left voice message with the below information and also changed the Prozac dosage in computer.

## 2011-12-22 NOTE — Telephone Encounter (Signed)
Since he wants an injectable testosterone, he needs to see Urology. I have alreadt done the referral. We will increase the Prozac to 40 mg a day. Call in one year supply. He may take two of the 20 mg tablets that he has now

## 2012-01-02 ENCOUNTER — Telehealth: Payer: Self-pay | Admitting: Family Medicine

## 2012-01-02 MED ORDER — FLUOXETINE HCL 20 MG PO TABS: 20.0000 mg | ORAL_TABLET | Freq: Every day | ORAL | Status: DC

## 2012-01-02 NOTE — Telephone Encounter (Signed)
Okay for a one year supply

## 2012-01-02 NOTE — Telephone Encounter (Signed)
I called in script 

## 2012-01-02 NOTE — Telephone Encounter (Signed)
Refill request for Fluoxetine HCL 20 mg take 1 po qd and a 90 day supply to [facility].

## 2012-01-22 ENCOUNTER — Other Ambulatory Visit: Payer: Self-pay | Admitting: Family Medicine

## 2012-01-22 NOTE — Telephone Encounter (Signed)
Should pt be taking 2 tabs twice a day?

## 2012-01-23 ENCOUNTER — Telehealth: Payer: Self-pay

## 2012-01-23 NOTE — Telephone Encounter (Signed)
error 

## 2012-08-19 ENCOUNTER — Telehealth: Payer: Self-pay | Admitting: Family Medicine

## 2012-08-19 NOTE — Telephone Encounter (Signed)
Pt's prior auth for ALVESCO is denied. Must use an alternative: flovent, asmanex, etc. Thank you.

## 2012-08-20 MED ORDER — FLUTICASONE PROPIONATE HFA 220 MCG/ACT IN AERO: 2.0000 | INHALATION_SPRAY | Freq: Two times a day (BID) | RESPIRATORY_TRACT | Status: DC

## 2012-08-20 NOTE — Telephone Encounter (Signed)
Stop the Alvesco and switch to Flovent HFA 220 mcg, take 2 puffs bid, call in one year supply

## 2012-08-20 NOTE — Telephone Encounter (Signed)
I sent script e-scribe and spoke with pt. 

## 2012-09-18 ENCOUNTER — Other Ambulatory Visit: Payer: Self-pay | Admitting: Family Medicine

## 2012-09-20 NOTE — Telephone Encounter (Signed)
Can we refill these? 

## 2012-09-21 ENCOUNTER — Other Ambulatory Visit: Payer: Self-pay | Admitting: Family Medicine

## 2012-10-30 ENCOUNTER — Other Ambulatory Visit: Payer: Self-pay | Admitting: Family Medicine

## 2012-12-06 ENCOUNTER — Other Ambulatory Visit: Payer: Self-pay | Admitting: Family Medicine

## 2012-12-18 ENCOUNTER — Other Ambulatory Visit: Payer: Self-pay | Admitting: Family Medicine

## 2013-01-19 ENCOUNTER — Ambulatory Visit (INDEPENDENT_AMBULATORY_CARE_PROVIDER_SITE_OTHER): Payer: BC Managed Care – PPO | Admitting: Family Medicine

## 2013-01-19 ENCOUNTER — Encounter: Payer: Self-pay | Admitting: Family Medicine

## 2013-01-19 VITALS — BP 140/80 | HR 84 | Temp 98.4°F | Ht 64.75 in | Wt 225.0 lb

## 2013-01-19 DIAGNOSIS — E785 Hyperlipidemia, unspecified: Secondary | ICD-10-CM

## 2013-01-19 DIAGNOSIS — G4733 Obstructive sleep apnea (adult) (pediatric): Secondary | ICD-10-CM

## 2013-01-19 DIAGNOSIS — Z0000 Encounter for general adult medical examination without abnormal findings: Secondary | ICD-10-CM

## 2013-01-19 DIAGNOSIS — E119 Type 2 diabetes mellitus without complications: Secondary | ICD-10-CM

## 2013-01-19 LAB — HEPATIC FUNCTION PANEL
ALT: 22 U/L (ref 0–53)
AST: 23 U/L (ref 0–37)
Albumin: 4.6 g/dL (ref 3.5–5.2)
Total Protein: 7.5 g/dL (ref 6.0–8.3)

## 2013-01-19 LAB — CBC WITH DIFFERENTIAL/PLATELET
Basophils Absolute: 0 10*3/uL (ref 0.0–0.1)
Eosinophils Absolute: 0.1 10*3/uL (ref 0.0–0.7)
Eosinophils Relative: 1.8 % (ref 0.0–5.0)
Lymphocytes Relative: 14.8 % (ref 12.0–46.0)
MCHC: 34.1 g/dL (ref 30.0–36.0)
MCV: 90.7 fl (ref 78.0–100.0)
Monocytes Absolute: 0.6 10*3/uL (ref 0.1–1.0)
Neutrophils Relative %: 74 % (ref 43.0–77.0)
Platelets: 226 10*3/uL (ref 150.0–400.0)
RBC: 5.61 Mil/uL (ref 4.22–5.81)
WBC: 6.6 10*3/uL (ref 4.5–10.5)

## 2013-01-19 LAB — POCT URINALYSIS DIPSTICK
Bilirubin, UA: NEGATIVE
Blood, UA: NEGATIVE
Glucose, UA: NEGATIVE
Ketones, UA: NEGATIVE
Leukocytes, UA: NEGATIVE
Nitrite, UA: NEGATIVE
Urobilinogen, UA: 0.2
pH, UA: 7

## 2013-01-19 LAB — HEMOGLOBIN A1C: Hgb A1c MFr Bld: 5.7 % (ref 4.6–6.5)

## 2013-01-19 LAB — BASIC METABOLIC PANEL
BUN: 18 mg/dL (ref 6–23)
Chloride: 100 mEq/L (ref 96–112)
Creatinine, Ser: 1.1 mg/dL (ref 0.4–1.5)
Glucose, Bld: 96 mg/dL (ref 70–99)
Potassium: 4.3 mEq/L (ref 3.5–5.1)

## 2013-01-19 LAB — LDL CHOLESTEROL, DIRECT: Direct LDL: 110 mg/dL

## 2013-01-19 LAB — LIPID PANEL: Cholesterol: 167 mg/dL (ref 0–200)

## 2013-01-19 MED ORDER — OLMESARTAN MEDOXOMIL-HCTZ 40-25 MG PO TABS: 1.0000 | ORAL_TABLET | Freq: Every day | ORAL | Status: DC

## 2013-01-19 MED ORDER — MONTELUKAST SODIUM 10 MG PO TABS: ORAL_TABLET | ORAL | Status: DC

## 2013-01-19 MED ORDER — SILDENAFIL CITRATE 50 MG PO TABS: 50.0000 mg | ORAL_TABLET | Freq: Every day | ORAL | Status: DC | PRN

## 2013-01-19 NOTE — Progress Notes (Signed)
  Subjective:    Patient ID: Alejandro Hill, male    DOB: [birth year], 57 y.o.   MRN: [id number]  HPI 57 yr old male for a cpx. He feels well but he thinks the sleep apnea has returned. He had UPPP surgery years ago and this worked well for a time. Lately though his wife says he snores more and he stops breathing frequently and then gasps for breath.    Review of Systems  Constitutional: Negative.   HENT: Negative.   Eyes: Negative.   Respiratory: Negative.   Cardiovascular: Negative.   Gastrointestinal: Negative.   Genitourinary: Negative.   Musculoskeletal: Negative.   Skin: Negative.   Neurological: Negative.   Psychiatric/Behavioral: Negative.        Objective:   Physical Exam  Constitutional: He is oriented to person, place, and time. He appears well-developed and well-nourished. No distress.  HENT:  Head: Normocephalic and atraumatic.  Right Ear: External ear normal.  Left Ear: External ear normal.  Nose: Nose normal.  Mouth/Throat: Oropharynx is clear and moist. No oropharyngeal exudate.  Eyes: Conjunctivae and EOM are normal. Pupils are equal, round, and reactive to light. Right eye exhibits no discharge. Left eye exhibits no discharge. No scleral icterus.  Neck: Neck supple. No JVD present. No tracheal deviation present. No thyromegaly present.  Cardiovascular: Normal rate, regular rhythm, normal heart sounds and intact distal pulses.  Exam reveals no gallop and no friction rub.   No murmur heard. EKG normal   Pulmonary/Chest: Effort normal and breath sounds normal. No respiratory distress. He has no wheezes. He has no rales. He exhibits no tenderness.  Abdominal: Soft. Bowel sounds are normal. He exhibits no distension and no mass. There is no tenderness. There is no rebound and no guarding.  Genitourinary: Rectum normal, prostate normal and penis normal. Guaiac negative stool. No penile tenderness.  Musculoskeletal: Normal range of motion. He exhibits no edema and no  tenderness.  Lymphadenopathy:    He has no cervical adenopathy.  Neurological: He is alert and oriented to person, place, and time. He has normal reflexes. No cranial nerve deficit. He exhibits normal muscle tone. Coordination normal.  Skin: Skin is warm and dry. No rash noted. He is not diaphoretic. No erythema. No pallor.  Psychiatric: He has a normal mood and affect. His behavior is normal. Judgment and thought content normal.          Assessment & Plan:  well exam. Get fasting labs. Refer for a sleep study

## 2013-01-24 NOTE — Progress Notes (Signed)
Quick Note:  I put a copy of results in mail, per pt request. ______

## 2013-01-24 NOTE — Progress Notes (Signed)
Quick Note:  I spoke with pt ______ 

## 2013-03-02 ENCOUNTER — Encounter: Payer: Self-pay | Admitting: Pulmonary Disease

## 2013-03-02 ENCOUNTER — Ambulatory Visit (INDEPENDENT_AMBULATORY_CARE_PROVIDER_SITE_OTHER): Payer: BC Managed Care – PPO | Admitting: Pulmonary Disease

## 2013-03-02 VITALS — BP 132/82 | HR 68 | Temp 98.0°F | Ht 66.0 in | Wt 233.2 lb

## 2013-03-02 DIAGNOSIS — G4733 Obstructive sleep apnea (adult) (pediatric): Secondary | ICD-10-CM | POA: Insufficient documentation

## 2013-03-02 NOTE — Assessment & Plan Note (Signed)
The patient has a history of severe obstructive sleep apnea, and continues to be symptomatic despite having NSR/UP3.  He has had progressive symptoms over the years, and now is having nonrestorative sleep and significant daytime sleepiness. I have reviewed the pathophysiology of sleep apnea with him, including its impact to his quality of life and cardiovascular health. I think he needs to be treated with CPAP, and the patient is agreeable. I will set the patient up on cpap at a moderate pressure level to allow for desensitization, and will troubleshoot the device over the next 4-6weeks if needed.  The pt is to call me if having issues with tolerance.  Will then optimize the pressure once patient is able to wear cpap on a consistent basis.

## 2013-03-02 NOTE — Progress Notes (Signed)
Subjective:    Patient ID: Alejandro Hill, male    DOB: Mar 13, 1956, 57 y.o.   MRN: [id number]  HPI The patient is a 57 year old male who I have been asked to see for management of obstructive sleep apnea. He was diagnosed in 2007 with severe OSA, with an AHI of 38 events per hour. He did not want to try CPAP at that time, and underwent a nasal septal reconstruction, along with tonsillectomy and UP3. He did have improvement in his symptoms, but they were persistent. Over the years these have worsened, and he currently has loud snoring as well as witnessed apneas during the night. He has frequent awakenings, and nonrestorative sleep. He notes significant inappropriate daytime sleepiness in the afternoons, and will fall asleep watching TV or movies in the evening. He has some sleep pressure while driving. His weight is up about 10 pounds over the last 2 years, and his Epworth score today is 13.   Sleep Questionnaire What time do you typically go to bed?( Between what hours) 11am 11am at 0938 on 03/02/13 by Maisie Fus, CMA How long does it take you to fall asleep? 20-25min 20-40min at 0938 on 03/02/13 by Maisie Fus, CMA How many times during the night do you wake up? 4 4 at 0938 on 03/02/13 by Maisie Fus, CMA What time do you get out of bed to start your day? 0600 0600 at 0938 on 03/02/13 by Maisie Fus, CMA Do you drive or operate heavy machinery in your occupation? No No at 0938 on 03/02/13 by Maisie Fus, CMA How much has your weight changed (up or down) over the past two years? (In pounds) 10 lb (4.536 kg)10 lb (4.536 kg) up/down at 0938 on 03/02/13 by Maisie Fus, CMA Have you ever had a sleep study before? Yes Yes at 0938 on 03/02/13 by Maisie Fus, CMA If yes, location of study? [facility] at [phone] on 03/02/13 by Maisie Fus, CMA If yes, date of study? 09/17/2005 09/17/2005 at 0938 on 03/02/13 by Maisie Fus, CMA Do you currently use CPAP? No No at  0938 on 03/02/13 by Maisie Fus, CMA Do you wear oxygen at any time? No No at 0938 on 03/02/13 by Maisie Fus, CMA   Review of Systems  Constitutional: Negative for fever and unexpected weight change.  HENT: Positive for congestion. Negative for dental problem, ear pain, nosebleeds, postnasal drip, rhinorrhea, sinus pressure, sneezing, sore throat and trouble swallowing.   Eyes: Negative for redness and itching.  Respiratory: Positive for shortness of breath. Negative for cough, chest tightness and wheezing.   Cardiovascular: Negative for palpitations and leg swelling.  Gastrointestinal: Negative for nausea and vomiting.  Genitourinary: Negative for dysuria.  Musculoskeletal: Negative for joint swelling.  Skin: Negative for rash.  Neurological: Negative for headaches.  Hematological: Does not bruise/bleed easily.  Psychiatric/Behavioral: Negative for dysphoric mood. The patient is not nervous/anxious.        Objective:   Physical Exam Constitutional:  Obese male, no acute distress  HENT:  Nares patent without discharge  Oropharynx without exudate, palate and uvula are trimmed, but palate still elongated.  Eyes:  Perrla, eomi, no scleral icterus  Neck:  No JVD, no TMG  Cardiovascular:  Normal rate, regular rhythm, no rubs or gallops.  No murmurs        Intact distal pulses  Pulmonary :  Normal breath sounds, no stridor or respiratory distress   No  rales, rhonchi, or wheezing  Abdominal:  Soft, nondistended, bowel sounds present.  No tenderness noted.   Musculoskeletal:  No lower extremity edema noted.  Lymph Nodes:  No cervical lymphadenopathy noted  Skin:  No cyanosis noted  Neurologic:  Appears sleepy but appropriate, moves all 4 extremities without obvious deficit.         Assessment & Plan:

## 2013-03-02 NOTE — Patient Instructions (Signed)
Will start on cpap at a moderate pressure level.  Please call if having issues with tolerance Work on weight loss followup with me in 8 weeks.  

## 2013-04-15 ENCOUNTER — Encounter: Payer: Self-pay | Admitting: Family Medicine

## 2013-04-15 ENCOUNTER — Ambulatory Visit (INDEPENDENT_AMBULATORY_CARE_PROVIDER_SITE_OTHER): Payer: BC Managed Care – PPO | Admitting: Family Medicine

## 2013-04-15 ENCOUNTER — Telehealth: Payer: Self-pay | Admitting: Family Medicine

## 2013-04-15 VITALS — BP 150/70 | HR 102 | Temp 99.8°F | Ht 64.75 in | Wt 231.0 lb

## 2013-04-15 DIAGNOSIS — J45909 Unspecified asthma, uncomplicated: Secondary | ICD-10-CM

## 2013-04-15 DIAGNOSIS — J209 Acute bronchitis, unspecified: Secondary | ICD-10-CM

## 2013-04-15 MED ORDER — SILDENAFIL CITRATE 50 MG PO TABS: 50.0000 mg | ORAL_TABLET | ORAL | Status: DC | PRN

## 2013-04-15 MED ORDER — HYDROCODONE-HOMATROPINE 5-1.5 MG/5ML PO SYRP: 5.0000 mL | ORAL_SOLUTION | ORAL | Status: DC | PRN

## 2013-04-15 MED ORDER — AZITHROMYCIN 250 MG PO TABS: ORAL_TABLET | ORAL | Status: DC

## 2013-04-15 NOTE — Progress Notes (Signed)
   Subjective:    Patient ID: Alejandro Hill, male    DOB: 1955/12/10, 58 y.o.   MRN: [id number]  HPI Here for 2 days of stuffy head, PND, chest tightness and coughing up green sputum.    Review of Systems  Constitutional: Positive for fever.  HENT: Positive for congestion and postnasal drip. Negative for sinus pressure.   Eyes: Negative.   Respiratory: Positive for cough, chest tightness and wheezing.        Objective:   Physical Exam  Constitutional: He appears well-developed and well-nourished.  HENT:  Right Ear: External ear normal.  Left Ear: External ear normal.  Nose: Nose normal.  Mouth/Throat: Oropharynx is clear and moist.  Eyes: Conjunctivae are normal.  Pulmonary/Chest: Effort normal. No respiratory distress. He has no rales.  Scattered wheezes and rhonchi  Lymphadenopathy:    He has no cervical adenopathy.          Assessment & Plan:  Given a Zpack and he will add Mucinex. He will use his albuterol per nebulizer prn

## 2013-04-15 NOTE — Telephone Encounter (Signed)
Patient Information:  Caller Name: Rikki  Phone: [phone]  Patient: Alejandro Hill, Alejandro Hill  Gender: Male  DOB: 1955-11-06  Age: 58 Years  PCP: Alysia Penna [facility])  Office Follow Up:  Does the office need to follow up with this patient?: No  Instructions For The Office: N/A   Symptoms  Reason For Call & Symptoms: Pt is calling and states that he has a bad cough and congestion in chest and head; ribs are hurting from coughing;  Reviewed Health History In EMR: Yes  Reviewed Medications In EMR: Yes  Reviewed Allergies In EMR: Yes  Reviewed Surgeries / Procedures: Yes  Date of Onset of Symptoms: 04/13/2013  Treatments Tried: Mucinex; Nyquil; fluids with honey  Treatments Tried Worked: No  Guideline(s) Used:  Cough  Disposition Per Guideline:   See Today in Office  Reason For Disposition Reached:   Severe coughing spells (e.g., whooping sound after coughing, vomiting after coughing)  Advice Given:  Coughing Spasms:  Drink warm fluids. Inhale warm mist (Reason: both relax the airway and loosen up the phlegm).  Suck on cough drops or hard candy to coat the irritated throat.  Prevent Dehydration:  Drink adequate liquids.  This will help soothe an irritated or dry throat and loosen up the phlegm.  Call Back If:  You become worse.  Patient Will Follow Care Advice:  YES  Appointment Scheduled:  04/15/2013 14:15:00 Appointment Scheduled Provider:  Alysia Penna [facility])

## 2013-04-15 NOTE — Progress Notes (Signed)
Pre visit review using our clinic review tool, if applicable. No additional management support is needed unless otherwise documented below in the visit note. 

## 2013-04-20 ENCOUNTER — Telehealth: Payer: Self-pay | Admitting: Family Medicine

## 2013-04-20 NOTE — Telephone Encounter (Signed)
Caller: Christien/Patient; Phone: [phone]; Reason for Call: Pt states he was prescribed a Zpak on Friday 1/16 and that his symptoms have improved but have not resolved.  Still having significant congestion.  Pt is requesting a refill of the Zpak.  Pt declined discussing sxs further/triage.  Assured pt will send message for MD review per his request and that someone will f/u with him today with MD recommendations/instructions.  Agreed to plan.

## 2013-04-20 NOTE — Telephone Encounter (Signed)
Call in another Zpack  

## 2013-04-21 MED ORDER — AZITHROMYCIN 250 MG PO TABS: ORAL_TABLET | ORAL | Status: DC

## 2013-04-21 NOTE — Telephone Encounter (Signed)
Rx sent to pharmacy.  Called and spoke with pt to make aware.

## 2013-04-21 NOTE — Telephone Encounter (Signed)
Alejandro Hill/Patient Phone [phone] called regarding medication.  Reports he called twice 04/20/13.  Upset no one returned call 04/20/13 or 04/21/13. Declined triage.  Asking for another Zpack. Seen 04/15/13.  Finished Zpack but congestion continues. Per Epic note 04/20/13, informed Dr Sarajane Jews approved refill of Zpack  after hours on 04/20/13 so RN will call refill now.  Rx called to [place] [facility] [place] at Azithromycin 250 mg tablets, Dispense #6, Sig: take as directed, no refills per MD order.

## 2013-04-25 ENCOUNTER — Telehealth: Payer: Self-pay | Admitting: Family Medicine

## 2013-04-25 NOTE — Telephone Encounter (Signed)
Iris from [facility] called to report that the patient has been in contact with her and he is requesting a nebulizer for home use.  Please fax an order to [phone] with diagnosis codes. Patient has asthma 493.90

## 2013-04-26 NOTE — Telephone Encounter (Signed)
I wrote a rx to fax in for the nebulizer. Also call in albuterol 0.083% vials to use in it q 4 hours prn SOB, 75 with 11 rf

## 2013-04-26 NOTE — Telephone Encounter (Signed)
Script for machine was faxed to below number. I spoke with pt and he does not need any of the medication right now for the nebulizer.

## 2013-05-03 ENCOUNTER — Encounter: Payer: Self-pay | Admitting: Pulmonary Disease

## 2013-05-03 ENCOUNTER — Ambulatory Visit (INDEPENDENT_AMBULATORY_CARE_PROVIDER_SITE_OTHER): Payer: BC Managed Care – PPO | Admitting: Pulmonary Disease

## 2013-05-03 VITALS — BP 124/72 | HR 84 | Temp 98.1°F | Ht 66.0 in | Wt 236.0 lb

## 2013-05-03 DIAGNOSIS — G4733 Obstructive sleep apnea (adult) (pediatric): Secondary | ICD-10-CM

## 2013-05-03 NOTE — Patient Instructions (Signed)
Will have your machine put on auto to better control your sleep apnea.  Let me know if this does not work out for you.  Work on weight loss followup with me in 52mos if doing well.

## 2013-05-03 NOTE — Progress Notes (Signed)
   Subjective:    Patient ID: Alejandro Hill, male    DOB: 04/29/1955, 58 y.o.   MRN: [id number]  HPI Patient comes in today for followup of his obstructive sleep apnea. He was started on CPAP at a moderate pressure level at the last visit, and has done well with the device. He is having no significant mask issues or pressure intolerances. His download shows excellent compliance, but he does have some mild breakthrough on his pressure of 10 cm. He is also having a borderline mask leak, but the patient feels he is doing much better with this.   Review of Systems  Constitutional: Negative for fever and unexpected weight change.  HENT: Negative for congestion, dental problem, ear pain, nosebleeds, postnasal drip, rhinorrhea, sinus pressure, sneezing, sore throat and trouble swallowing.   Eyes: Negative for redness and itching.  Respiratory: Negative for cough, chest tightness, shortness of breath and wheezing.   Cardiovascular: Negative for palpitations and leg swelling.  Gastrointestinal: Negative for nausea and vomiting.  Genitourinary: Negative for dysuria.  Musculoskeletal: Negative for joint swelling.  Skin: Negative for rash.  Neurological: Negative for headaches.  Hematological: Does not bruise/bleed easily.  Psychiatric/Behavioral: Negative for dysphoric mood. The patient is not nervous/anxious.        Objective:   Physical Exam Obese male in no acute distress Nose without purulence or discharge noted No skin breakdown or pressure necrosis from the CPAP mask Neck without lymphadenopathy or thyromegaly Lower extremities without significant edema, no cyanosis Alert and oriented, moves all 4 extremities.       Assessment & Plan:

## 2013-05-03 NOTE — Assessment & Plan Note (Signed)
The patient has done well since the last visit on CPAP, and he feels that he is sleeping better with increased daytime alertness. He is having no significant issues with his mask fit or pressure. At this point, I will put his device on the automatic setting, and I've encouraged him to work aggressively on weight loss.

## 2013-07-14 ENCOUNTER — Other Ambulatory Visit: Payer: Self-pay | Admitting: Family Medicine

## 2013-10-30 ENCOUNTER — Other Ambulatory Visit: Payer: Self-pay | Admitting: Family Medicine

## 2013-10-31 ENCOUNTER — Other Ambulatory Visit: Payer: Self-pay | Admitting: Family Medicine

## 2013-11-01 ENCOUNTER — Encounter: Payer: Self-pay | Admitting: Pulmonary Disease

## 2013-11-01 ENCOUNTER — Ambulatory Visit (INDEPENDENT_AMBULATORY_CARE_PROVIDER_SITE_OTHER): Payer: BC Managed Care – PPO | Admitting: Pulmonary Disease

## 2013-11-01 VITALS — BP 120/74 | HR 74 | Temp 98.7°F | Ht 66.0 in | Wt 224.6 lb

## 2013-11-01 DIAGNOSIS — G4733 Obstructive sleep apnea (adult) (pediatric): Secondary | ICD-10-CM

## 2013-11-01 NOTE — Progress Notes (Signed)
   Subjective:    Patient ID: Alejandro Hill, male    DOB: 11/09/1955, 58 y.o.   MRN: [id number]  HPI The patient comes in today for followup of his obstructive sleep apnea. He is wearing CPAP compliantly, and is having no issues with his pressure. He is having some issues with mask leak, and thinks his size may be a little too large. He is due for a new mask, and will look at different sizes. He feels that he is sleeping much better with the device, and has lost 12 pounds since last visit.   Review of Systems  Constitutional: Negative for fever and unexpected weight change.  HENT: Negative for congestion, dental problem, ear pain, nosebleeds, postnasal drip, rhinorrhea, sinus pressure, sneezing, sore throat and trouble swallowing.   Eyes: Negative for redness and itching.  Respiratory: Negative for cough, chest tightness, shortness of breath and wheezing.   Cardiovascular: Negative for palpitations and leg swelling.  Gastrointestinal: Negative for nausea and vomiting.  Genitourinary: Negative for dysuria.  Musculoskeletal: Negative for joint swelling.  Skin: Negative for rash.  Neurological: Negative for headaches.  Hematological: Does not bruise/bleed easily.  Psychiatric/Behavioral: Negative for dysphoric mood. The patient is not nervous/anxious.        Objective:   Physical Exam Overweight male in no acute distress Nose without purulence or discharge noted No skin breakdown or pressure necrosis from the CPAP mask Neck without lymphadenopathy or thyromegaly Lower extremities without edema, no cyanosis Alert and oriented, moves all 4 extremities.       Assessment & Plan:

## 2013-11-01 NOTE — Patient Instructions (Signed)
Continue with cpap, and keep up with mask changes and supplies. Work on weight loss followup with me again in one year.  

## 2013-11-01 NOTE — Assessment & Plan Note (Signed)
The patient is doing very well with CPAP overall, and has seen improvement in his sleep and daytime alertness. I've asked him to continue with his device, and to work aggressively on weight loss. His current mask may not be fitting properly, and he is looking at a replacement.

## 2013-12-07 ENCOUNTER — Emergency Department (HOSPITAL_COMMUNITY)
Admission: EM | Admit: 2013-12-07 | Discharge: 2013-12-07 | Disposition: A | Discharge status: Home / Self Care | Payer: BC Managed Care – PPO | Attending: Emergency Medicine | Admitting: Emergency Medicine

## 2013-12-07 ENCOUNTER — Encounter (HOSPITAL_COMMUNITY): Payer: Self-pay | Admitting: Emergency Medicine

## 2013-12-07 DIAGNOSIS — Z880 Allergy status to penicillin: Secondary | ICD-10-CM | POA: Insufficient documentation

## 2013-12-07 DIAGNOSIS — Z79899 Other long term (current) drug therapy: Secondary | ICD-10-CM | POA: Diagnosis not present

## 2013-12-07 DIAGNOSIS — E785 Hyperlipidemia, unspecified: Secondary | ICD-10-CM | POA: Diagnosis not present

## 2013-12-07 DIAGNOSIS — K648 Other hemorrhoids: Secondary | ICD-10-CM | POA: Diagnosis not present

## 2013-12-07 DIAGNOSIS — R011 Cardiac murmur, unspecified: Secondary | ICD-10-CM | POA: Insufficient documentation

## 2013-12-07 DIAGNOSIS — K625 Hemorrhage of anus and rectum: Secondary | ICD-10-CM | POA: Diagnosis present

## 2013-12-07 DIAGNOSIS — J45909 Unspecified asthma, uncomplicated: Secondary | ICD-10-CM | POA: Diagnosis not present

## 2013-12-07 DIAGNOSIS — Z8669 Personal history of other diseases of the nervous system and sense organs: Secondary | ICD-10-CM | POA: Insufficient documentation

## 2013-12-07 DIAGNOSIS — I10 Essential (primary) hypertension: Secondary | ICD-10-CM | POA: Diagnosis not present

## 2013-12-07 DIAGNOSIS — E119 Type 2 diabetes mellitus without complications: Secondary | ICD-10-CM | POA: Diagnosis not present

## 2013-12-07 DIAGNOSIS — K649 Unspecified hemorrhoids: Secondary | ICD-10-CM

## 2013-12-07 LAB — BASIC METABOLIC PANEL
ANION GAP: 14 (ref 5–15)
BUN: 25 mg/dL — AB (ref 6–23)
CHLORIDE: 99 meq/L (ref 96–112)
CO2: 23 mEq/L (ref 19–32)
Calcium: 9.7 mg/dL (ref 8.4–10.5)
Creatinine, Ser: 0.99 mg/dL (ref 0.50–1.35)
GFR calc Af Amer: >90 mL/min (ref >90)
GFR calc non Af Amer: 87 mL/min — ABNORMAL LOW (ref >90)
Glucose, Bld: 106 mg/dL — ABNORMAL HIGH (ref 70–99)
POTASSIUM: 5.1 meq/L (ref 3.7–5.3)
Sodium: 136 mEq/L — ABNORMAL LOW (ref 137–147)

## 2013-12-07 LAB — CBC WITH DIFFERENTIAL/PLATELET
Basophils Absolute: 0 10*3/uL (ref 0.0–0.1)
Basophils Relative: 0 % (ref 0–1)
Eosinophils Absolute: 0 10*3/uL (ref 0.0–0.7)
Eosinophils Relative: 0 % (ref 0–5)
HCT: 48 % (ref 39.0–52.0)
Hemoglobin: 17.3 g/dL — ABNORMAL HIGH (ref 13.0–17.0)
Lymphocytes Relative: 13 % (ref 12–46)
Lymphs Abs: 1.3 10*3/uL (ref 0.7–4.0)
MCH: 31.8 pg (ref 26.0–34.0)
MCHC: 36 g/dL (ref 30.0–36.0)
MCV: 88.2 fL (ref 78.0–100.0)
Monocytes Absolute: 0.6 10*3/uL (ref 0.1–1.0)
Monocytes Relative: 6 % (ref 3–12)
Neutro Abs: 7.6 10*3/uL (ref 1.7–7.7)
Neutrophils Relative %: 81 % — ABNORMAL HIGH (ref 43–77)
Platelets: 198 10*3/uL (ref 150–400)
RBC: 5.44 MIL/uL (ref 4.22–5.81)
RDW: 13.2 % (ref 11.5–15.5)
WBC: 9.5 10*3/uL (ref 4.0–10.5)

## 2013-12-07 MED ORDER — OXYCODONE-ACETAMINOPHEN 5-325 MG PO TABS
2.0000 | ORAL_TABLET | Freq: Once | ORAL | Status: AC
  Administered 2013-12-07: 2 via ORAL
  Filled 2013-12-07: qty 2

## 2013-12-07 MED ORDER — ONDANSETRON 4 MG PO TBDP
4.0000 mg | ORAL_TABLET | Freq: Once | ORAL | Status: AC
  Administered 2013-12-07: 4 mg via ORAL
  Filled 2013-12-07: qty 1

## 2013-12-07 MED ORDER — POLYETHYLENE GLYCOL 3350 17 GM/SCOOP PO POWD: ORAL | Status: DC

## 2013-12-07 MED ORDER — HYDROCODONE-ACETAMINOPHEN 5-325 MG PO TABS: 1.0000 | ORAL_TABLET | ORAL | Status: DC | PRN

## 2013-12-07 MED ORDER — LIDOCAINE 5 % EX OINT
TOPICAL_OINTMENT | Freq: Once | CUTANEOUS | Status: AC
  Administered 2013-12-07: 1 via TOPICAL
  Filled 2013-12-07: qty 35.44

## 2013-12-07 NOTE — ED Notes (Signed)
:   Pt reports around 1515 started bleeding rectally because of hemorrhoid hemorrhage that was repaired 6 or 7 years ago.  Pt denies n/v/d, pain, sob.  Pt noticed bleeding after a bowel movement and pt states BM has not been hard enough to cause bleeding. Pt reports lightheadedness at home.

## 2013-12-07 NOTE — ED Notes (Signed)
Our P.A., Ledell Noss has deferred the Hemoccult testing as she saw blood around anus with some visible hemorrhoids.

## 2013-12-07 NOTE — ED Notes (Signed)
Bed: ZG01 Expected date:  Expected time:  Means of arrival:  Comments: [facility] bleeding hemorrhoids

## 2013-12-07 NOTE — ED Provider Notes (Signed)
CSN: [id number]     Arrival date & time 12/07/13  1715 History   First MD Initiated Contact with Patient 12/07/13 1728     Chief Complaint  Patient presents with  . Rectal Bleeding     (Consider location/radiation/quality/duration/timing/severity/associated sxs/prior Treatment) HPI Comments: Alejandro Hill is a(n) 58 y.o. male who presents with cc of rectal bleeding. The patient states that that his rectal bleeding began spontaneously at about 4 pm. He states that is was dripping "like a faucet and that he went through 4 rolls of TP trying to stop the bleeding. He has had rectal bleeding from his hemorrhoids before but has always gotten it to stop within about 5 minutes. He drove himself to the paramedics in [place] so that he would not have to wait for EMS from [facility]. He wrapped himself in garbage bags.  He denies use of anticoagulants, constipation, rectal pain.  He has a history of previous prolapsed hemorrhoids that were repaired by Dr. Marlou Starks about 7 years ago.  Patient is a 58 y.o. male presenting with hematochezia. The history is provided by the patient. No language interpreter was used.  Rectal Bleeding Quality:  Bright red Amount:  Copious Duration:  4 hours Timing:  Constant Progression:  Resolved Chronicity:  Recurrent Context: hemorrhoids and spontaneously   Context: not anal fissures, not anal penetration, not constipation, not defecation, not diarrhea, not rectal injury and not rectal pain   Similar prior episodes: yes   Relieved by:  Nothing Worsened by:  Nothing tried Associated symptoms: no abdominal pain, no dizziness, no epistaxis, no fever, no hematemesis, no loss of consciousness, no recent illness and no vomiting   Risk factors: no anticoagulant use      Past Medical History  Diagnosis Date  . Migraine     cluster, [facility]  . Hyperlipidemia   . Hypertension   . Heart murmur     as a child  . Sleep apnea     saw Dr Keturah Barre  .  Thyroid disease     hypothyroidism  . Asthma     exertional  . Diabetes mellitus     type II diabetes. Sees Dr Chalmers Cater  . Hypogonadism male     sees Dr. Diona Fanti   Past Surgical History  Procedure Laterality Date  . Tonsillectomy    . Uvulopalatopharyngoplasty      per Dr Ernesto Rutherford  . Hernia repair  08/03/07    left inguinal herniorrhaphy. Dr Marlou Starks  . Colonoscopy  02-19-09    per Dr. Deatra Ina, repeat in 10 yrs   . Cataract extraction Bilateral 01/2011   Family History  Problem Relation Age of Onset  . Heart disease Other   . Hyperlipidemia Other   . Stroke Other    History  Substance Use Topics  . Smoking status: Never Smoker   . Smokeless tobacco: Never Used  . Alcohol Use: Yes     Comment: 1-2 drink per week    Review of Systems  Constitutional: Negative for fever.  HENT: Negative for nosebleeds.   Gastrointestinal: Positive for blood in stool, hematochezia and anal bleeding. Negative for vomiting, abdominal pain and hematemesis.  Neurological: Negative for dizziness and loss of consciousness.  All other systems reviewed and are negative.     Allergies  Penicillins  Home Medications   Prior to Admission medications   Medication Sig Start Date End Date Taking? Authorizing Provider  albuterol (PROVENTIL HFA;VENTOLIN HFA) 108 (90 BASE) MCG/ACT inhaler  Inhale 2 puffs into the lungs every 6 (six) hours as needed for wheezing or shortness of breath.   Yes Historical Provider, MD  Cinnamon 500 MG TABS Take 2 tablets by mouth daily.    Yes Historical Provider, MD  Garlic 629 MG CAPS Take 2 capsules by mouth daily.    Yes Historical Provider, MD  metFORMIN (GLUMETZA) 500 MG (MOD) 24 hr tablet Take 1,000 mg by mouth daily with breakfast.   Yes Historical Provider, MD  montelukast (SINGULAIR) 10 MG tablet Take 10 mg by mouth daily.   Yes Historical Provider, MD  olmesartan-hydrochlorothiazide (BENICAR HCT) 40-25 MG per tablet Take 0.5 tablets by mouth daily.   Yes Historical  Provider, MD  Omega-3 Fatty Acids (OMEGA 3 PO) Take 2 tablets by mouth daily.   Yes Historical Provider, MD  pravastatin (PRAVACHOL) 40 MG tablet Take 40 mg by mouth daily.   Yes Historical Provider, MD  sildenafil (VIAGRA) 50 MG tablet Take 1 tablet (50 mg total) by mouth as needed for erectile dysfunction. 04/15/13  Yes Laurey Morale, MD  verapamil (COVERA HS) 240 MG (CO) 24 hr tablet Take 240 mg by mouth daily.   Yes Historical Provider, MD   BP 120/84  Pulse 108  Temp(Src) 99 F (37.2 C) (Oral)  Resp 14  Ht 5\' 6"  (1.676 m)  Wt 210 lb (95.255 kg)  BMI 33.91 kg/m2  SpO2 95% Physical Exam  Nursing note and vitals reviewed. Constitutional: He appears well-developed and well-nourished. No distress.  HENT:  Head: Normocephalic and atraumatic.  Eyes: Conjunctivae are normal. No scleral icterus.  Neck: Normal range of motion. Neck supple.  Cardiovascular: Normal rate, regular rhythm and normal heart sounds.   Pulmonary/Chest: Effort normal and breath sounds normal. No respiratory distress.  Abdominal: Soft. There is no tenderness.  Genitourinary: Rectal exam shows internal hemorrhoid. Rectal exam shows no fissure and no tenderness.     Musculoskeletal: He exhibits no edema.  Neurological: He is alert.  Skin: Skin is warm and dry. He is not diaphoretic.  Psychiatric: His behavior is normal.    ED Course  Procedures (including critical care time) Labs Review Labs Reviewed  CBC  BASIC METABOLIC PANEL  POC OCCULT BLOOD, ED    Imaging Review No results found.   EKG Interpretation None      MDM   Final diagnoses:  Bleeding hemorrhoids    Filed Vitals:   12/07/13 1722 12/07/13 2013  BP: 120/84 167/79  Pulse: 108 101  Temp: 99 F (37.2 C)   TempSrc: Oral   Resp: 14   Height: 5\' 6"  (1.676 m)   Weight: 210 lb (95.255 kg)   SpO2: 95% 98%    Patient labs show polycythemia. He is hemodynamically stable. Bleeding is well controlled at this time. Patient is to be  discharged with topical lidocaine ointment, I have advised the patient to buy otc products for swelling reduction.  Patient is advised to follow up with [facility]. Patient / Family / Caregiver informed of clinical course, understand medical decision-making process, and agree with plan.   I personally reviewed the imaging tests through PACS system. I have reviewed and interpreted Lab values. I reviewed available ER/hospitalization records through the [place], [place] 12/12/13 2012

## 2013-12-07 NOTE — Discharge Instructions (Signed)
Apply lidocaine ointment up to 4 times a day. You may purchase and wear sanitary pads for rectal bleeding. Call [place] [facility] as soon as possible for an appointment.   Hemorrhoids Hemorrhoids are swollen veins around the rectum or anus. There are two types of hemorrhoids:   Internal hemorrhoids. These occur in the veins just inside the rectum. They may poke through to the outside and become irritated and painful.  External hemorrhoids. These occur in the veins outside the anus and can be felt as a painful swelling or hard lump near the anus. CAUSES  Pregnancy.   Obesity.   Constipation or diarrhea.   Straining to have a bowel movement.   Sitting for long periods on the toilet.  Heavy lifting or other activity that caused you to strain.  Anal intercourse. SYMPTOMS   Pain.   Anal itching or irritation.   Rectal bleeding.   Fecal leakage.   Anal swelling.   One or more lumps around the anus.  DIAGNOSIS  Your caregiver may be able to diagnose hemorrhoids by visual examination. Other examinations or tests that may be performed include:   Examination of the rectal area with a gloved hand (digital rectal exam).   Examination of anal canal using a small tube (scope).   A blood test if you have lost a significant amount of blood.  A test to look inside the colon (sigmoidoscopy or colonoscopy). TREATMENT Most hemorrhoids can be treated at home. However, if symptoms do not seem to be getting better or if you have a lot of rectal bleeding, your caregiver may perform a procedure to help make the hemorrhoids get smaller or remove them completely. Possible treatments include:   Placing a rubber band at the base of the hemorrhoid to cut off the circulation (rubber band ligation).   Injecting a chemical to shrink the hemorrhoid (sclerotherapy).   Using a tool to burn the hemorrhoid (infrared light therapy).   Surgically removing the hemorrhoid  (hemorrhoidectomy).   Stapling the hemorrhoid to block blood flow to the tissue (hemorrhoid stapling).  HOME CARE INSTRUCTIONS   Eat foods with fiber, such as whole grains, beans, nuts, fruits, and vegetables. Ask your doctor about taking products with added fiber in them (fibersupplements).  Increase fluid intake. Drink enough water and fluids to keep your urine clear or pale yellow.   Exercise regularly.   Go to the bathroom when you have the urge to have a bowel movement. Do not wait.   Avoid straining to have bowel movements.   Keep the anal area dry and clean. Use wet toilet paper or moist towelettes after a bowel movement.   Medicated creams and suppositories may be used or applied as directed.   Only take over-the-counter or prescription medicines as directed by your caregiver.   Take warm sitz baths for 15-20 minutes, 3-4 times a day to ease pain and discomfort.   Place ice packs on the hemorrhoids if they are tender and swollen. Using ice packs between sitz baths may be helpful.   Put ice in a plastic bag.   Place a towel between your skin and the bag.   Leave the ice on for 15-20 minutes, 3-4 times a day.   Do not use a donut-shaped pillow or sit on the toilet for long periods. This increases blood pooling and pain.  SEEK MEDICAL CARE IF:  You have increasing pain and swelling that is not controlled by treatment or medicine.  You have uncontrolled bleeding.  You have difficulty or you are unable to have a bowel movement.  You have pain or inflammation outside the area of the hemorrhoids. MAKE SURE YOU:  Understand these instructions.  Will watch your condition.  Will get help right away if you are not doing well or get worse. Document Released: 03/14/2000 Document Revised: 03/03/2012 Document Reviewed: 01/20/2012 [facility] Patient Information 2015 [place], [place]. This information is not intended to replace advice given to you by your health  care provider. Make sure you discuss any questions you have with your health care provider.

## 2013-12-07 NOTE — ED Notes (Addendum)
Per EMS: Pt reports around 1515 started bleeding rectally because of hemorrhoid hemorrhage that was repaired 6 or 7 years ago.  Pt denies n/v/d, pain, sob.  Pt noticed bleeding after a bowel movement and pt states BM has not been hard enough to cause bleeding. Pt reports lightheadedness at home.  Vitals: 150/70, Hr110, 16 RR, 97 % RA

## 2013-12-08 ENCOUNTER — Telehealth: Payer: Self-pay | Admitting: Family Medicine

## 2013-12-08 NOTE — Telephone Encounter (Signed)
Pt wants to talk to you about his bleeding hemorrhoids.  He said the office he's referred to can't see him soon and he was in the ED due to the excessive bleeding. I offered an appointment but he refused and wants a callback.

## 2013-12-08 NOTE — Telephone Encounter (Signed)
The patient called back and wanted me to give him a call regarding on getting him in with Dr Marlou Starks  Today , I did not speak with pt  Because I was doing a stat referral  he spoke with Juliann Pulse .pt was very demanding stating I should give him a call NOW .  I Called [place] [facility] spoke with Karna Christmas.  she informed me that the patient spoke with their office manager crystal and she offered him an appointment  for 12-09-2013 Friday , but patient  Refused and canceled the appointment  ,because he wanted to be seen today 12-08-2013 and [facility] [place] could not  accommodated the patient because of  The few  doctors that was in . Coralyn Mark stated the patient was rude and use profanity. AND REFUSED to hang up . And she stated crystal their office manager will call me back  ,because she spoke with the patient  the office manager crystal did call me back to inform me that an appointment was offered for the patient, he refused  and he wasn't going to hang up and demand to speaks with  Dr. Marlou Starks   , the Dr spoke with the patient and informed him he could not see the patient today 12-08-2013 . Per crystal pt was very rude and angery .

## 2013-12-08 NOTE — Telephone Encounter (Signed)
Patient Information:  Caller Name: Wood  Phone: [phone]  Patient: Alejandro Hill, Alejandro Hill  Gender: Male  DOB: [birth year]  Age: 58 Years  PCP: Alysia Penna [facility])  Office Follow Up:  Does the office need to follow up with this patient?: Yes  Instructions For The Office: "I WANT TO GO STRAIGHT TO A SPECIALIST"!. requesting someone to call him now with a referral to be seen today.  He is requesting that we call Dr. Altamese [place] office and work him in.  He declined appt in our office Please contact patient.  RN Note:  "I WANT TO GO STRAIGHT TO A SPECIALIST"!. requesting someone to call him now with a referral to be seen today.  He is requesting that we call Dr. Altamese [place] office and work him in.  He declined appt in our office PLEASE CONTACT PATIENT.  Symptoms  Reason For Call & Symptoms: Patient states he has "bleeding hemorrhoids". They began to bleed yesterday and he ended up in the ER last night 12/07/13.  He tried to contact Dr. Altamese [place] office today and they cannot see him until Monday, 12/12/13.  He would like [place] to call and make them work him in today.  Hemorrhoids began to bleed again today after a BM . He has bleeding controlled currently by putting Cornstarch in the area.  He declines an appt here for evaluation. He would like an appt with a specialist and request that we call.  Reviewed Health History In EMR: Yes  Reviewed Medications In EMR: Yes  Reviewed Allergies In EMR: Yes  Reviewed Surgeries / Procedures: Yes  Date of Onset of Symptoms: 12/07/2013  Guideline(s) Used:  Rectal Symptoms  Rectal Bleeding  Disposition Per Guideline:   See Today in Office  Reason For Disposition Reached:   Blood passed alone without any stool  Advice Given:  Call Back If:  Bleeding increases in amount  You become worse.  Stool Softener for Hard Bowel Movements:  Stool softeners help reduce rectal pain during bowel movements.  Colace (docusate sodium) is available OTC. Adult dosage 100 mg PO  qd.  RN Overrode Recommendation:  Make Appointment  "I WANT TO GO STRAIGHT TO A SPECIALIST"!. requesting someone to call him now with a referral to be seen today.  He is requesting that we call Dr. Altamese [place] office and work him in.  He declined appt in our office PLEASE CONTACT PATIENT.

## 2013-12-08 NOTE — Telephone Encounter (Signed)
I spoke with [place] and she asked if you could call Dr. Cindra Eves office and see if they can possibly work him in either today or tomorrow?

## 2013-12-09 NOTE — Telephone Encounter (Signed)
It is my understanding that Dr. Marlou Starks offered to see him in his office today, and I think this is his best option. Encourage him to keep this appt

## 2013-12-09 NOTE — Telephone Encounter (Signed)
I spoke with pt on 12/08/13 and advised him to keep his appointment on 12/12/13 because he did not have the Friday appointment yet ( 12/08/13 ). I called Dr. Gwendolyn Fill office today and was told that pt was given an appointment for Friday 12/09/13 and pt cancelled this appointment.

## 2013-12-13 NOTE — ED Provider Notes (Signed)
Medical screening examination/treatment/procedure(s) were performed by non-physician practitioner and as supervising physician I was immediately available for consultation/collaboration.   EKG Interpretation None        [place], DO 12/13/13 [phone]

## 2014-01-12 ENCOUNTER — Other Ambulatory Visit: Payer: Self-pay | Admitting: Family Medicine

## 2014-01-25 ENCOUNTER — Ambulatory Visit (INDEPENDENT_AMBULATORY_CARE_PROVIDER_SITE_OTHER): Payer: BC Managed Care – PPO | Admitting: Family Medicine

## 2014-01-25 ENCOUNTER — Encounter: Payer: Self-pay | Admitting: Family Medicine

## 2014-01-25 VITALS — BP 159/88 | HR 61 | Temp 98.7°F | Ht 66.0 in | Wt 228.0 lb

## 2014-01-25 DIAGNOSIS — Z0000 Encounter for general adult medical examination without abnormal findings: Secondary | ICD-10-CM

## 2014-01-25 DIAGNOSIS — E119 Type 2 diabetes mellitus without complications: Secondary | ICD-10-CM

## 2014-01-25 DIAGNOSIS — K429 Umbilical hernia without obstruction or gangrene: Secondary | ICD-10-CM | POA: Insufficient documentation

## 2014-01-25 LAB — TSH: TSH: 1.25 u[IU]/mL (ref 0.35–4.50)

## 2014-01-25 LAB — MICROALBUMIN / CREATININE URINE RATIO
Creatinine,U: 232.1 mg/dL
MICROALB UR: 2.3 mg/dL — AB (ref 0.0–1.9)
Microalb Creat Ratio: 1 mg/g (ref 0.0–30.0)

## 2014-01-25 LAB — POCT URINALYSIS DIPSTICK
Bilirubin, UA: NEGATIVE
GLUCOSE UA: NEGATIVE
Ketones, UA: NEGATIVE
LEUKOCYTES UA: NEGATIVE
NITRITE UA: NEGATIVE
PH UA: 5.5
RBC UA: NEGATIVE
Spec Grav, UA: 1.025
UROBILINOGEN UA: 0.2

## 2014-01-25 LAB — HEPATIC FUNCTION PANEL
ALBUMIN: 3.8 g/dL (ref 3.5–5.2)
ALK PHOS: 69 U/L (ref 39–117)
ALT: 28 U/L (ref 0–53)
AST: 20 U/L (ref 0–37)
Bilirubin, Direct: 0.2 mg/dL (ref 0.0–0.3)
TOTAL PROTEIN: 7.1 g/dL (ref 6.0–8.3)
Total Bilirubin: 1 mg/dL (ref 0.2–1.2)

## 2014-01-25 LAB — CBC WITH DIFFERENTIAL/PLATELET
Basophils Absolute: 0 10*3/uL (ref 0.0–0.1)
Basophils Relative: 0.5 % (ref 0.0–3.0)
EOS ABS: 0.1 10*3/uL (ref 0.0–0.7)
Eosinophils Relative: 0.7 % (ref 0.0–5.0)
HCT: 50.8 % (ref 39.0–52.0)
Hemoglobin: 17.5 g/dL — ABNORMAL HIGH (ref 13.0–17.0)
Lymphocytes Relative: 10.3 % — ABNORMAL LOW (ref 12.0–46.0)
Lymphs Abs: 0.9 10*3/uL (ref 0.7–4.0)
MCHC: 34.5 g/dL (ref 30.0–36.0)
MCV: 89.6 fl (ref 78.0–100.0)
Monocytes Absolute: 0.6 10*3/uL (ref 0.1–1.0)
Monocytes Relative: 6.2 % (ref 3.0–12.0)
NEUTROS PCT: 82.3 % — AB (ref 43.0–77.0)
Neutro Abs: 7.6 10*3/uL (ref 1.4–7.7)
PLATELETS: 236 10*3/uL (ref 150.0–400.0)
RBC: 5.67 Mil/uL (ref 4.22–5.81)
RDW: 13.6 % (ref 11.5–15.5)
WBC: 9.3 10*3/uL (ref 4.0–10.5)

## 2014-01-25 LAB — BASIC METABOLIC PANEL
BUN: 21 mg/dL (ref 6–23)
CO2: 28 mEq/L (ref 19–32)
Calcium: 9.1 mg/dL (ref 8.4–10.5)
Chloride: 99 mEq/L (ref 96–112)
Creatinine, Ser: 1 mg/dL (ref 0.4–1.5)
GFR: 79.06 mL/min (ref >60.00)
GLUCOSE: 76 mg/dL (ref 70–99)
POTASSIUM: 4.1 meq/L (ref 3.5–5.1)
Sodium: 138 mEq/L (ref 135–145)

## 2014-01-25 LAB — HEMOGLOBIN A1C: HEMOGLOBIN A1C: 5.4 % (ref 4.6–6.5)

## 2014-01-25 LAB — LIPID PANEL
CHOLESTEROL: 189 mg/dL (ref 0–200)
HDL: 41.3 mg/dL (ref >39.00)
LDL CALC: 109 mg/dL — AB (ref 0–99)
NonHDL: 147.7
Total CHOL/HDL Ratio: 5
Triglycerides: 195 mg/dL — ABNORMAL HIGH (ref 0.0–149.0)
VLDL: 39 mg/dL (ref 0.0–40.0)

## 2014-01-25 LAB — PSA: PSA: 1.34 ng/mL (ref 0.10–4.00)

## 2014-01-25 MED ORDER — VERAPAMIL HCL 240 MG (CO) PO TB24: 240.0000 mg | ORAL_TABLET | Freq: Every day | ORAL | Status: DC

## 2014-01-25 MED ORDER — TADALAFIL 20 MG PO TABS: 20.0000 mg | ORAL_TABLET | Freq: Every day | ORAL | Status: DC | PRN

## 2014-01-25 MED ORDER — HYDROCODONE-ACETAMINOPHEN 5-325 MG PO TABS: 1.0000 | ORAL_TABLET | Freq: Four times a day (QID) | ORAL | Status: DC | PRN

## 2014-01-25 MED ORDER — LISINOPRIL-HYDROCHLOROTHIAZIDE 20-25 MG PO TABS: 1.0000 | ORAL_TABLET | Freq: Every day | ORAL | Status: DC

## 2014-01-25 MED ORDER — METFORMIN HCL ER (MOD) 500 MG PO TB24: 1000.0000 mg | ORAL_TABLET | Freq: Every day | ORAL | Status: DC

## 2014-01-25 MED ORDER — MONTELUKAST SODIUM 10 MG PO TABS: 10.0000 mg | ORAL_TABLET | Freq: Every day | ORAL | Status: DC

## 2014-01-25 NOTE — Progress Notes (Signed)
   Subjective:    Patient ID: Alejandro Hill, male    DOB: October 20, 1955, 58 y.o.   MRN: [id number]  HPI 58 yr old male for a cpx. He has one main concern and this is about his umbilical hernia. We have been watching this for several years but lately it has gotten larger and has become painful.    Review of Systems  Constitutional: Negative.   HENT: Negative.   Eyes: Negative.   Respiratory: Negative.   Cardiovascular: Negative.   Gastrointestinal: Positive for abdominal pain. Negative for nausea, vomiting, diarrhea, constipation, blood in stool, abdominal distention, anal bleeding and rectal pain.  Genitourinary: Negative.   Musculoskeletal: Negative.   Skin: Negative.   Neurological: Negative.   Psychiatric/Behavioral: Negative.        Objective:   Physical Exam  Constitutional: He is oriented to person, place, and time. He appears well-developed and well-nourished. No distress.  HENT:  Head: Normocephalic and atraumatic.  Right Ear: External ear normal.  Left Ear: External ear normal.  Nose: Nose normal.  Mouth/Throat: Oropharynx is clear and moist. No oropharyngeal exudate.  Eyes: Conjunctivae and EOM are normal. Pupils are equal, round, and reactive to light. Right eye exhibits no discharge. Left eye exhibits no discharge. No scleral icterus.  Neck: Neck supple. No JVD present. No tracheal deviation present. No thyromegaly present.  Cardiovascular: Normal rate, regular rhythm, normal heart sounds and intact distal pulses.  Exam reveals no gallop and no friction rub.   No murmur heard. EKG normal  Pulmonary/Chest: Effort normal and breath sounds normal. No respiratory distress. He has no wheezes. He has no rales. He exhibits no tenderness.  Abdominal: Soft. Bowel sounds are normal. He exhibits no distension. There is no rebound and no guarding.  Tender reducible umbilical hernia   Genitourinary: Rectum normal, prostate normal and penis normal. Guaiac negative stool. No penile  tenderness.  Musculoskeletal: Normal range of motion. He exhibits no edema and no tenderness.  Lymphadenopathy:    He has no cervical adenopathy.  Neurological: He is alert and oriented to person, place, and time. He has normal reflexes. No cranial nerve deficit. He exhibits normal muscle tone. Coordination normal.  Skin: Skin is warm and dry. No rash noted. He is not diaphoretic. No erythema. No pallor.  Psychiatric: He has a normal mood and affect. His behavior is normal. Judgment and thought content normal.          Assessment & Plan:  Well exam. Get fasting labs. We will refer to Surgery for the hernia.

## 2014-01-25 NOTE — Progress Notes (Signed)
Pre visit review using our clinic review tool, if applicable. No additional management support is needed unless otherwise documented below in the visit note. 

## 2014-02-01 ENCOUNTER — Other Ambulatory Visit (INDEPENDENT_AMBULATORY_CARE_PROVIDER_SITE_OTHER): Payer: Self-pay | Admitting: General Surgery

## 2014-02-10 NOTE — Pre-Procedure Instructions (Signed)
BOY DELAMATER  02/10/2014   Your procedure is scheduled on:  Monday, Nov. 23rd   Report to [facility] Admitting at  7:30 AM.  Call this number if you have problems the morning of surgery: [phone]   Remember:   Do not eat food or drink liquids after midnight Sunday.   Take these medicines the morning of surgery with A SIP OF WATER:  Hydrocodone (if needed).  Please use inhaler that morning.                           Stop taking herbal medication, anti-inflammatories 4-5 days prior to surgery.               DO NOT TAKE YOUR DIABETIC MEDICATION THAT MORNING.                          Do not wear jewelry - No rings or watches.  Do not wear lotions or colognes.   You may NOT  wear deodorant the morning of surgery.   Men may shave face and neck.   Do not bring valuables to the hospital.  [facility] is not responsible for any belongings or valuables.               Contacts, dentures or bridgework may not be worn into surgery.  Leave suitcase in the car. After surgery it may be brought to your room.                 Patients discharged the day of surgery will not be allowed to drive home, and you will need someone to stay with you for the first 24 hrs.   Name and phone number of your driver:    Special Instructions: "Preparing for Surgery" instruction sheet.   Please read over the following fact sheets that you were given: Pain Booklet, Coughing and Deep Breathing and Surgical Site Infection Prevention

## 2014-02-13 ENCOUNTER — Encounter (HOSPITAL_COMMUNITY): Payer: Self-pay

## 2014-02-13 ENCOUNTER — Encounter (HOSPITAL_COMMUNITY)
Admission: RE | Admit: 2014-02-13 | Discharge: 2014-02-13 | Disposition: A | Discharge status: Home / Self Care | Payer: BC Managed Care – PPO | Source: Ambulatory Visit | Attending: General Surgery | Admitting: General Surgery

## 2014-02-13 ENCOUNTER — Other Ambulatory Visit (HOSPITAL_COMMUNITY): Payer: Self-pay | Admitting: *Deleted

## 2014-02-13 DIAGNOSIS — E669 Obesity, unspecified: Secondary | ICD-10-CM | POA: Insufficient documentation

## 2014-02-13 DIAGNOSIS — E291 Testicular hypofunction: Secondary | ICD-10-CM | POA: Insufficient documentation

## 2014-02-13 DIAGNOSIS — J45909 Unspecified asthma, uncomplicated: Secondary | ICD-10-CM | POA: Insufficient documentation

## 2014-02-13 DIAGNOSIS — N200 Calculus of kidney: Secondary | ICD-10-CM | POA: Diagnosis not present

## 2014-02-13 DIAGNOSIS — Z01818 Encounter for other preprocedural examination: Secondary | ICD-10-CM | POA: Insufficient documentation

## 2014-02-13 DIAGNOSIS — E785 Hyperlipidemia, unspecified: Secondary | ICD-10-CM | POA: Insufficient documentation

## 2014-02-13 DIAGNOSIS — G4733 Obstructive sleep apnea (adult) (pediatric): Secondary | ICD-10-CM | POA: Diagnosis not present

## 2014-02-13 DIAGNOSIS — E119 Type 2 diabetes mellitus without complications: Secondary | ICD-10-CM | POA: Insufficient documentation

## 2014-02-13 DIAGNOSIS — G44009 Cluster headache syndrome, unspecified, not intractable: Secondary | ICD-10-CM | POA: Insufficient documentation

## 2014-02-13 DIAGNOSIS — I10 Essential (primary) hypertension: Secondary | ICD-10-CM | POA: Insufficient documentation

## 2014-02-13 DIAGNOSIS — K429 Umbilical hernia without obstruction or gangrene: Secondary | ICD-10-CM | POA: Diagnosis not present

## 2014-02-13 HISTORY — DX: Umbilical hernia without obstruction or gangrene: K42.9

## 2014-02-13 HISTORY — DX: Anemia, unspecified: D64.9

## 2014-02-13 HISTORY — DX: Other seasonal allergic rhinitis: J30.2

## 2014-02-13 HISTORY — DX: Pneumonia, unspecified organism: J18.9

## 2014-02-13 HISTORY — DX: Calculus of kidney: N20.0

## 2014-02-13 HISTORY — DX: Unspecified hemorrhoids: K64.9

## 2014-02-13 LAB — BASIC METABOLIC PANEL
ANION GAP: 16 — AB (ref 5–15)
BUN: 19 mg/dL (ref 6–23)
CALCIUM: 9.3 mg/dL (ref 8.4–10.5)
CHLORIDE: 100 meq/L (ref 96–112)
CO2: 24 meq/L (ref 19–32)
CREATININE: 1.01 mg/dL (ref 0.50–1.35)
GFR calc Af Amer: >90 mL/min (ref >90)
GFR calc non Af Amer: 79 mL/min — ABNORMAL LOW (ref >90)
Glucose, Bld: 142 mg/dL — ABNORMAL HIGH (ref 70–99)
Potassium: 3.9 mEq/L (ref 3.7–5.3)
SODIUM: 140 meq/L (ref 137–147)

## 2014-02-13 LAB — CBC
HCT: 51.8 % (ref 39.0–52.0)
Hemoglobin: 18.5 g/dL — ABNORMAL HIGH (ref 13.0–17.0)
MCH: 31.8 pg (ref 26.0–34.0)
MCHC: 35.7 g/dL (ref 30.0–36.0)
MCV: 89 fL (ref 78.0–100.0)
Platelets: 181 10*3/uL (ref 150–400)
RBC: 5.82 MIL/uL — AB (ref 4.22–5.81)
RDW: 12.7 % (ref 11.5–15.5)
WBC: 6.4 10*3/uL (ref 4.0–10.5)

## 2014-02-13 LAB — NO BLOOD PRODUCTS

## 2014-02-13 NOTE — Pre-Procedure Instructions (Signed)
Alejandro Hill  02/13/2014   Your procedure is scheduled on:  Monday, February 20, 2014 at 9:30 AM.   Report to [facility] Entrance "A" Admitting Office at 7:30 AM.   Call this number if you have problems the morning of surgery: [phone]               Any questions prior to day of surgery, please call [phone].   Remember:   Do not eat food or drink liquids after midnight Sunday, 02/19/14.   Take these medicines the morning of surgery with A SIP OF WATER: montelukast (SINGULAIR), verapamil (COVERA HS), HYDROcodone-acetaminophen (NORCO) - if needed, albuterol (PROVENTIL HFA;VENTOLIN HFA) inhaler - if needed.   Do not wear jewelry.  Do not wear lotions, powders, or cologne. You may wear deodorant.  Men may shave face and neck.  Do not bring valuables to the hospital.  [facility] is not responsible                  for any belongings or valuables.               Contacts, dentures or bridgework may not be worn into surgery.  Leave suitcase in the car. After surgery it may be brought to your room.  For patients admitted to the hospital, discharge time is determined by your                treatment team.               Patients discharged the day of surgery will not be allowed to drive home.    Special Instructions: [facility] - Preparing for Surgery  Before surgery, you can play an important role.  Because skin is not sterile, your skin needs to be as free of germs as possible.  You can reduce the number of germs on you skin by washing with CHG (chlorahexidine gluconate) soap before surgery.  CHG is an antiseptic cleaner which kills germs and bonds with the skin to continue killing germs even after washing.  Please DO NOT use if you have an allergy to CHG or antibacterial soaps.  If your skin becomes reddened/irritated stop using the CHG and inform your nurse when you arrive at Short Stay.  Do not shave (including legs and underarms) for at least 48 hours prior to the  first CHG shower.  You may shave your face.  Please follow these instructions carefully:   1.  Shower with CHG Soap the night before surgery and the                                morning of Surgery.  2.  If you choose to wash your hair, wash your hair first as usual with your       normal shampoo.  3.  After you shampoo, rinse your hair and body thoroughly to remove the                      Shampoo.  4.  Use CHG as you would any other liquid soap.  You can apply chg directly       to the skin and wash gently with scrungie or a clean washcloth.  5.  Apply the CHG Soap to your body ONLY FROM THE NECK DOWN.        Do not use on open wounds or open sores.  Avoid contact with your eyes, ears, mouth and genitals (private parts).  Wash genitals (private parts) with your normal soap.  6.  Wash thoroughly, paying special attention to the area where your surgery        will be performed.  7.  Thoroughly rinse your body with warm water from the neck down.  8.  DO NOT shower/wash with your normal soap after using and rinsing off       the CHG Soap.  9.  Pat yourself dry with a clean towel.            10.  Wear clean pajamas.            11.  Place clean sheets on your bed the night of your first shower and do not        sleep with pets.  Day of Surgery  Do not apply any lotions the morning of surgery.  Please wear clean clothes to the hospital.     Please read over the following fact sheets that you were given: Pain Booklet, Coughing and Deep Breathing and Surgical Site Infection Prevention

## 2014-02-13 NOTE — Pre-Procedure Instructions (Signed)
Alejandro Hill  02/13/2014   Your procedure is scheduled on:  Monday, February 20, 2014 at 9:30 AM.   Report to [facility] Entrance "A" Admitting Office at 7:30 AM.   Call this number if you have problems the morning of surgery: [phone]               Any questions prior to day of surgery, please call [phone].   Remember:   Do not eat food or drink liquids after midnight Sunday, 02/19/14.   Take these medicines the morning of surgery with A SIP OF WATER: montelukast (SINGULAIR), verapamil (COVERA HS), HYDROcodone-acetaminophen (NORCO) - if needed, albuterol (PROVENTIL HFA;VENTOLIN HFA) inhaler - if needed.  Stop Vitamins, Herbal Medications and Fish Oil as of today. Do NOT take any of your diabetic medications the morning of surgery.   Do not wear jewelry.  Do not wear lotions, powders, or cologne. You may wear deodorant.  Men may shave face and neck.  Do not bring valuables to the hospital.  [facility] is not responsible                  for any belongings or valuables.               Contacts, dentures or bridgework may not be worn into surgery.  Leave suitcase in the car. After surgery it may be brought to your room.  For patients admitted to the hospital, discharge time is determined by your                treatment team.               Patients discharged the day of surgery will not be allowed to drive home.    Special Instructions: [place] - Preparing for Surgery  Before surgery, you can play an important role.  Because skin is not sterile, your skin needs to be as free of germs as possible.  You can reduce the number of germs on you skin by washing with CHG (chlorahexidine gluconate) soap before surgery.  CHG is an antiseptic cleaner which kills germs and bonds with the skin to continue killing germs even after washing.  Please DO NOT use if you have an allergy to CHG or antibacterial soaps.  If your skin becomes reddened/irritated stop using the CHG and  inform your nurse when you arrive at Short Stay.  Do not shave (including legs and underarms) for at least 48 hours prior to the first CHG shower.  You may shave your face.  Please follow these instructions carefully:   1.  Shower with CHG Soap the night before surgery and the                                morning of Surgery.  2.  If you choose to wash your hair, wash your hair first as usual with your       normal shampoo.  3.  After you shampoo, rinse your hair and body thoroughly to remove the                      Shampoo.  4.  Use CHG as you would any other liquid soap.  You can apply chg directly       to the skin and wash gently with scrungie or a clean washcloth.  5.  Apply the CHG Soap to  your body ONLY FROM THE NECK DOWN.        Do not use on open wounds or open sores.  Avoid contact with your eyes, ears, mouth and genitals (private parts).  Wash genitals (private parts) with your normal soap.  6.  Wash thoroughly, paying special attention to the area where your surgery        will be performed.  7.  Thoroughly rinse your body with warm water from the neck down.  8.  DO NOT shower/wash with your normal soap after using and rinsing off       the CHG Soap.  9.  Pat yourself dry with a clean towel.            10.  Wear clean pajamas.            11.  Place clean sheets on your bed the night of your first shower and do not        sleep with pets.  Day of Surgery  Do not apply any lotions the morning of surgery.  Please wear clean clothes to the hospital.     Please read over the following fact sheets that you were given: Pain Booklet, Coughing and Deep Breathing and Surgical Site Infection Prevention

## 2014-02-13 NOTE — Progress Notes (Signed)
Pt's PCP is Dr. Alysia Penna. Pt denies any cardiac history. Denies chest pain or sob.

## 2014-02-14 NOTE — Progress Notes (Signed)
Anesthesia Chart Review:  Patient is a 58 year old male scheduled for UHR on 02/20/14 by Dr. Marlou Starks.  History includes non-smoker, HTN, HLD, OSA with CPAP use (Dr. Gwenette Greet), s/p UPPP and tonsillectomy, asthma, nephrolithiasis, childhood anemia, childhood murmur, DM2, cluster headaches, hypogonadism, left IHR. BMI is consistent with obesity. PCP is Dr. Alysia Penna with CPE on 01/25/14 including labs, EKG and CCS referral for umbilical hernia.   EKG on 01/25/14 (reviewed by PCP) showed SB at 59 bpm, non-specific T wave abnormality (most pronounced in inferior leads). His T wave abnormality is a little more pronounced, but overall, his EKG appears stable since his 2013/2014 EKGs.    Preoperative labs noted.  H/H 18.5/51.8 (previously 17.5/50.8). He has had mildly elevated H/H in the past with recent PCP evaluation.  He signed an refusal of Blood Products except only albumin or albumin containing products.   Anticipate that he can proceed as planned if no acute changes.   George Hugh [facility] Short Stay Center/Anesthesiology Phone [phone] 02/14/2014 1:56 PM

## 2014-02-19 MED ORDER — CIPROFLOXACIN IN D5W 400 MG/200ML IV SOLN
400.0000 mg | INTRAVENOUS | Status: DC
  Filled 2014-02-19: qty 200

## 2014-02-19 MED ORDER — CHLORHEXIDINE GLUCONATE 4 % EX LIQD
1.0000 "application " | Freq: Once | CUTANEOUS | Status: DC
  Filled 2014-02-19: qty 15

## 2014-02-19 MED ORDER — CHLORHEXIDINE GLUCONATE 4 % EX LIQD
1.0000 | Freq: Once | CUTANEOUS | Status: DC
Start: 2014-02-19 — End: 2014-02-20
  Filled 2014-02-19: qty 15

## 2014-02-20 ENCOUNTER — Ambulatory Visit (HOSPITAL_COMMUNITY): Payer: BC Managed Care – PPO | Admitting: Vascular Surgery

## 2014-02-20 ENCOUNTER — Ambulatory Visit (HOSPITAL_COMMUNITY): Payer: BC Managed Care – PPO | Admitting: Certified Registered"

## 2014-02-20 ENCOUNTER — Ambulatory Visit (HOSPITAL_COMMUNITY)
Admission: RE | Admit: 2014-02-20 | Discharge: 2014-02-21 | Disposition: A | Discharge status: Home / Self Care | Payer: BC Managed Care – PPO | Source: Ambulatory Visit | Attending: General Surgery | Admitting: General Surgery

## 2014-02-20 ENCOUNTER — Encounter (HOSPITAL_COMMUNITY): Payer: Self-pay | Admitting: Certified Registered"

## 2014-02-20 ENCOUNTER — Encounter (HOSPITAL_COMMUNITY)
Admission: RE | Disposition: A | Discharge status: Home / Self Care | Payer: Self-pay | Source: Ambulatory Visit | Attending: General Surgery

## 2014-02-20 DIAGNOSIS — R339 Retention of urine, unspecified: Secondary | ICD-10-CM | POA: Diagnosis not present

## 2014-02-20 DIAGNOSIS — K644 Residual hemorrhoidal skin tags: Secondary | ICD-10-CM | POA: Diagnosis not present

## 2014-02-20 DIAGNOSIS — Z880 Allergy status to penicillin: Secondary | ICD-10-CM | POA: Diagnosis not present

## 2014-02-20 DIAGNOSIS — K429 Umbilical hernia without obstruction or gangrene: Secondary | ICD-10-CM | POA: Diagnosis present

## 2014-02-20 DIAGNOSIS — G43909 Migraine, unspecified, not intractable, without status migrainosus: Secondary | ICD-10-CM | POA: Insufficient documentation

## 2014-02-20 DIAGNOSIS — K648 Other hemorrhoids: Secondary | ICD-10-CM | POA: Diagnosis not present

## 2014-02-20 DIAGNOSIS — Z6833 Body mass index (BMI) 33.0-33.9, adult: Secondary | ICD-10-CM | POA: Diagnosis not present

## 2014-02-20 DIAGNOSIS — J45909 Unspecified asthma, uncomplicated: Secondary | ICD-10-CM | POA: Insufficient documentation

## 2014-02-20 DIAGNOSIS — I10 Essential (primary) hypertension: Secondary | ICD-10-CM | POA: Insufficient documentation

## 2014-02-20 DIAGNOSIS — G4730 Sleep apnea, unspecified: Secondary | ICD-10-CM | POA: Insufficient documentation

## 2014-02-20 HISTORY — PX: HEMORRHOID SURGERY: SHX153

## 2014-02-20 HISTORY — PX: INSERTION OF MESH: SHX5868

## 2014-02-20 HISTORY — PX: UMBILICAL HERNIA REPAIR: SHX196

## 2014-02-20 LAB — GLUCOSE, CAPILLARY
GLUCOSE-CAPILLARY: 113 mg/dL — AB (ref 70–99)
GLUCOSE-CAPILLARY: 108 mg/dL — AB (ref 70–99)

## 2014-02-20 SURGERY — REPAIR, HERNIA, UMBILICAL, ADULT
Anesthesia: General | Site: Anus

## 2014-02-20 MED ORDER — PROPOFOL 10 MG/ML IV BOLUS
INTRAVENOUS | Status: DC | PRN
  Administered 2014-02-20: 200 mg via INTRAVENOUS

## 2014-02-20 MED ORDER — PROMETHAZINE HCL 25 MG/ML IJ SOLN: 6.2500 mg | INTRAMUSCULAR | Status: DC | PRN

## 2014-02-20 MED ORDER — OXYCODONE HCL 5 MG PO TABS: 5.0000 mg | ORAL_TABLET | Freq: Once | ORAL | Status: DC | PRN

## 2014-02-20 MED ORDER — ROCURONIUM BROMIDE 100 MG/10ML IV SOLN
INTRAVENOUS | Status: DC | PRN
  Administered 2014-02-20: 5 mg via INTRAVENOUS
  Administered 2014-02-20: 50 mg via INTRAVENOUS

## 2014-02-20 MED ORDER — DIBUCAINE 1 % RE OINT
TOPICAL_OINTMENT | RECTAL | Status: DC | PRN
  Administered 2014-02-20: 1 via RECTAL

## 2014-02-20 MED ORDER — HYALURONIDASE HUMAN 150 UNIT/ML IJ SOLN
INTRAMUSCULAR | Status: DC | PRN
  Administered 2014-02-20: 150 [IU] via SUBCUTANEOUS

## 2014-02-20 MED ORDER — VERAPAMIL HCL ER 240 MG PO TBCR
240.0000 mg | EXTENDED_RELEASE_TABLET | Freq: Every day | ORAL | Status: DC
  Administered 2014-02-21: 240 mg via ORAL
  Filled 2014-02-20: qty 1

## 2014-02-20 MED ORDER — LISINOPRIL-HYDROCHLOROTHIAZIDE 20-25 MG PO TABS: 1.0000 | ORAL_TABLET | Freq: Every day | ORAL | Status: DC

## 2014-02-20 MED ORDER — HEMOSTATIC AGENTS (NO CHARGE) OPTIME
TOPICAL | Status: DC | PRN
  Administered 2014-02-20: 1 via TOPICAL

## 2014-02-20 MED ORDER — OXYCODONE HCL 5 MG/5ML PO SOLN: 5.0000 mg | Freq: Once | ORAL | Status: DC | PRN

## 2014-02-20 MED ORDER — ONDANSETRON HCL 4 MG PO TABS: 4.0000 mg | ORAL_TABLET | Freq: Four times a day (QID) | ORAL | Status: DC | PRN

## 2014-02-20 MED ORDER — FENTANYL CITRATE 0.05 MG/ML IJ SOLN
INTRAMUSCULAR | Status: DC | PRN
Start: 2014-02-20 — End: 2014-02-20
  Administered 2014-02-20: 100 ug via INTRAVENOUS
  Administered 2014-02-20 (×2): 50 ug via INTRAVENOUS
  Administered 2014-02-20: 150 ug via INTRAVENOUS

## 2014-02-20 MED ORDER — FENTANYL CITRATE 0.05 MG/ML IJ SOLN
INTRAMUSCULAR | Status: AC
  Filled 2014-02-20: qty 5

## 2014-02-20 MED ORDER — LIDOCAINE HCL (CARDIAC) 20 MG/ML IV SOLN
INTRAVENOUS | Status: DC | PRN
  Administered 2014-02-20: 100 mg via INTRAVENOUS

## 2014-02-20 MED ORDER — HYALURONIDASE HUMAN 150 UNIT/ML IJ SOLN
INTRAMUSCULAR | Status: AC
  Filled 2014-02-20: qty 1

## 2014-02-20 MED ORDER — MIDAZOLAM HCL 2 MG/2ML IJ SOLN
INTRAMUSCULAR | Status: AC
  Filled 2014-02-20: qty 2

## 2014-02-20 MED ORDER — ALBUTEROL SULFATE (2.5 MG/3ML) 0.083% IN NEBU: 3.0000 mL | INHALATION_SOLUTION | Freq: Four times a day (QID) | RESPIRATORY_TRACT | Status: DC | PRN

## 2014-02-20 MED ORDER — LACTATED RINGERS IV SOLN
INTRAVENOUS | Status: DC
  Administered 2014-02-20: 08:00:00 via INTRAVENOUS

## 2014-02-20 MED ORDER — ONDANSETRON HCL 4 MG/2ML IJ SOLN
INTRAMUSCULAR | Status: DC | PRN
  Administered 2014-02-20: 4 mg via INTRAVENOUS

## 2014-02-20 MED ORDER — BUPIVACAINE-EPINEPHRINE 0.25% -1:200000 IJ SOLN
INTRAMUSCULAR | Status: DC | PRN
  Administered 2014-02-20: 30 mL

## 2014-02-20 MED ORDER — LACTATED RINGERS IV SOLN
INTRAVENOUS | Status: DC | PRN
  Administered 2014-02-20 (×2): via INTRAVENOUS

## 2014-02-20 MED ORDER — GLYCOPYRROLATE 0.2 MG/ML IJ SOLN
INTRAMUSCULAR | Status: DC | PRN
  Administered 2014-02-20: .6 mg via INTRAVENOUS

## 2014-02-20 MED ORDER — HYDROCHLOROTHIAZIDE 25 MG PO TABS
25.0000 mg | ORAL_TABLET | Freq: Every day | ORAL | Status: DC
  Administered 2014-02-20 – 2014-02-21 (×2): 25 mg via ORAL
  Filled 2014-02-20 (×2): qty 1

## 2014-02-20 MED ORDER — DIBUCAINE 1 % RE OINT
TOPICAL_OINTMENT | RECTAL | Status: AC
  Filled 2014-02-20: qty 28

## 2014-02-20 MED ORDER — MORPHINE SULFATE 4 MG/ML IJ SOLN
4.0000 mg | INTRAMUSCULAR | Status: DC | PRN
  Administered 2014-02-20 – 2014-02-21 (×4): 4 mg via INTRAVENOUS
  Filled 2014-02-20 (×4): qty 1

## 2014-02-20 MED ORDER — LISINOPRIL 20 MG PO TABS
20.0000 mg | ORAL_TABLET | Freq: Every day | ORAL | Status: DC
  Administered 2014-02-20 – 2014-02-21 (×2): 20 mg via ORAL
  Filled 2014-02-20 (×2): qty 1

## 2014-02-20 MED ORDER — DOCUSATE SODIUM 100 MG PO CAPS
100.0000 mg | ORAL_CAPSULE | Freq: Two times a day (BID) | ORAL | Status: DC
  Administered 2014-02-20 – 2014-02-21 (×3): 100 mg via ORAL
  Filled 2014-02-20 (×3): qty 1

## 2014-02-20 MED ORDER — LIDOCAINE HCL (CARDIAC) 20 MG/ML IV SOLN
INTRAVENOUS | Status: AC
  Filled 2014-02-20: qty 5

## 2014-02-20 MED ORDER — CIPROFLOXACIN IN D5W 400 MG/200ML IV SOLN
INTRAVENOUS | Status: DC | PRN
  Administered 2014-02-20: 400 mg via INTRAVENOUS

## 2014-02-20 MED ORDER — NEOSTIGMINE METHYLSULFATE 10 MG/10ML IV SOLN
INTRAVENOUS | Status: DC | PRN
  Administered 2014-02-20: 4 mg via INTRAVENOUS

## 2014-02-20 MED ORDER — ONDANSETRON HCL 4 MG/2ML IJ SOLN: 4.0000 mg | Freq: Four times a day (QID) | INTRAMUSCULAR | Status: DC | PRN

## 2014-02-20 MED ORDER — 0.9 % SODIUM CHLORIDE (POUR BTL) OPTIME
TOPICAL | Status: DC | PRN
  Administered 2014-02-20: 1000 mL

## 2014-02-20 MED ORDER — HYDROMORPHONE HCL 1 MG/ML IJ SOLN
INTRAMUSCULAR | Status: AC
  Filled 2014-02-20: qty 1

## 2014-02-20 MED ORDER — BUPIVACAINE-EPINEPHRINE (PF) 0.25% -1:200000 IJ SOLN
INTRAMUSCULAR | Status: AC
  Filled 2014-02-20: qty 30

## 2014-02-20 MED ORDER — NEOSTIGMINE METHYLSULFATE 10 MG/10ML IV SOLN
INTRAVENOUS | Status: AC
  Filled 2014-02-20: qty 1

## 2014-02-20 MED ORDER — ONDANSETRON HCL 4 MG/2ML IJ SOLN
INTRAMUSCULAR | Status: AC
  Filled 2014-02-20: qty 2

## 2014-02-20 MED ORDER — HYDROMORPHONE HCL 1 MG/ML IJ SOLN
0.2500 mg | INTRAMUSCULAR | Status: DC | PRN
  Administered 2014-02-20 (×2): 0.5 mg via INTRAVENOUS

## 2014-02-20 MED ORDER — MIDAZOLAM HCL 5 MG/5ML IJ SOLN
INTRAMUSCULAR | Status: DC | PRN
  Administered 2014-02-20 (×2): 1 mg via INTRAVENOUS

## 2014-02-20 MED ORDER — ROCURONIUM BROMIDE 50 MG/5ML IV SOLN
INTRAVENOUS | Status: AC
  Filled 2014-02-20: qty 1

## 2014-02-20 MED ORDER — OXYCODONE-ACETAMINOPHEN 5-325 MG PO TABS
1.0000 | ORAL_TABLET | ORAL | Status: DC | PRN
  Administered 2014-02-20 – 2014-02-21 (×3): 2 via ORAL
  Filled 2014-02-20 (×3): qty 2

## 2014-02-20 MED ORDER — PROPOFOL 10 MG/ML IV BOLUS
INTRAVENOUS | Status: AC
  Filled 2014-02-20: qty 20

## 2014-02-20 MED ORDER — MONTELUKAST SODIUM 10 MG PO TABS
10.0000 mg | ORAL_TABLET | Freq: Every day | ORAL | Status: DC
  Administered 2014-02-21: 10 mg via ORAL
  Filled 2014-02-20: qty 1

## 2014-02-20 MED ORDER — METFORMIN HCL ER 500 MG PO TB24
1000.0000 mg | ORAL_TABLET | Freq: Every day | ORAL | Status: DC
  Administered 2014-02-21: 1000 mg via ORAL
  Filled 2014-02-20 (×2): qty 2

## 2014-02-20 MED ORDER — GLYCOPYRROLATE 0.2 MG/ML IJ SOLN
INTRAMUSCULAR | Status: AC
  Filled 2014-02-20: qty 3

## 2014-02-20 MED ORDER — KCL IN DEXTROSE-NACL 20-5-0.9 MEQ/L-%-% IV SOLN
INTRAVENOUS | Status: DC
  Administered 2014-02-20: 15:00:00 via INTRAVENOUS
  Filled 2014-02-20 (×2): qty 1000

## 2014-02-20 SURGICAL SUPPLY — 55 items
ADH SKN CLS APL DERMABOND .7 (GAUZE/BANDAGES/DRESSINGS) ×2
BLADE SURG 10 STRL SS (BLADE) ×3 IMPLANT
BLADE SURG 15 STRL LF DISP TIS (BLADE) ×2 IMPLANT
BLADE SURG 15 STRL SS (BLADE) ×6
BLADE SURG ROTATE 9660 (MISCELLANEOUS) ×1 IMPLANT
CANISTER SUCTION 2500CC (MISCELLANEOUS) ×3 IMPLANT
CHLORAPREP W/TINT 26ML (MISCELLANEOUS) ×3 IMPLANT
COVER SURGICAL LIGHT HANDLE (MISCELLANEOUS) ×4 IMPLANT
DECANTER SPIKE VIAL GLASS SM (MISCELLANEOUS) ×3 IMPLANT
DERMABOND ADVANCED (GAUZE/BANDAGES/DRESSINGS) ×1
DERMABOND ADVANCED .7 DNX12 (GAUZE/BANDAGES/DRESSINGS) ×2 IMPLANT
DRAPE PED LAPAROTOMY (DRAPES) ×3 IMPLANT
DRAPE PROXIMA HALF (DRAPES) ×3 IMPLANT
DRAPE UTILITY 15X26 W/TAPE STR (DRAPE) ×6 IMPLANT
DRAPE UTILITY XL STRL (DRAPES) ×2 IMPLANT
ELECT CAUTERY BLADE 6.4 (BLADE) ×3 IMPLANT
ELECT REM PT RETURN 9FT ADLT (ELECTROSURGICAL) ×3
ELECTRODE REM PT RTRN 9FT ADLT (ELECTROSURGICAL) ×2 IMPLANT
GAUZE SPONGE 4X4 12PLY STRL (GAUZE/BANDAGES/DRESSINGS) ×3 IMPLANT
GAUZE SPONGE 4X4 16PLY XRAY LF (GAUZE/BANDAGES/DRESSINGS) ×3 IMPLANT
GLOVE BIO SURGEON STRL SZ7.5 (GLOVE) ×6 IMPLANT
GLOVE BIOGEL PI IND STRL 7.0 (GLOVE) IMPLANT
GLOVE BIOGEL PI INDICATOR 7.0 (GLOVE) ×1
GOWN STRL REUS W/ TWL LRG LVL3 (GOWN DISPOSABLE) ×4 IMPLANT
GOWN STRL REUS W/TWL LRG LVL3 (GOWN DISPOSABLE) ×15
KIT BASIN OR (CUSTOM PROCEDURE TRAY) ×3 IMPLANT
KIT ROOM TURNOVER OR (KITS) ×3 IMPLANT
MESH VENTRALEX ST 1-7/10 CRC S (Mesh General) ×1 IMPLANT
NDL HYPO 25GX1X1/2 BEV (NEEDLE) ×2 IMPLANT
NEEDLE 18GX1X1/2 (RX/OR ONLY) (NEEDLE) ×3 IMPLANT
NEEDLE HYPO 25GX1X1/2 BEV (NEEDLE) ×3 IMPLANT
NS IRRIG 1000ML POUR BTL (IV SOLUTION) ×3 IMPLANT
PACK LITHOTOMY IV (CUSTOM PROCEDURE TRAY) ×3 IMPLANT
PACK SURGICAL SETUP 50X90 (CUSTOM PROCEDURE TRAY) ×3 IMPLANT
PAD ARMBOARD 7.5X6 YLW CONV (MISCELLANEOUS) ×6 IMPLANT
PENCIL BUTTON HOLSTER BLD 10FT (ELECTRODE) ×3 IMPLANT
SPONGE LAP 18X18 X RAY DECT (DISPOSABLE) ×4 IMPLANT
SPONGE SURGIFOAM ABS GEL 100 (HEMOSTASIS) ×2 IMPLANT
SPONGE SURGIFOAM ABS GEL 100C (HEMOSTASIS) IMPLANT
SURGILUBE 2OZ TUBE FLIPTOP (MISCELLANEOUS) ×4 IMPLANT
SUT CHROMIC 2 0 SH (SUTURE) ×6 IMPLANT
SUT MNCRL AB 4-0 PS2 18 (SUTURE) ×3 IMPLANT
SUT NOVA NAB DX-16 0-1 5-0 T12 (SUTURE) ×4 IMPLANT
SUT PROLENE 0 CT 1 CR/8 (SUTURE) IMPLANT
SUT VIC AB 2-0 SH 27 (SUTURE) ×3
SUT VIC AB 2-0 SH 27X BRD (SUTURE) ×2 IMPLANT
SUT VIC AB 3-0 SH 27 (SUTURE) ×3
SUT VIC AB 3-0 SH 27XBRD (SUTURE) ×2 IMPLANT
SYR BULB 3OZ (MISCELLANEOUS) ×3 IMPLANT
SYR CONTROL 10ML LL (SYRINGE) ×3 IMPLANT
TOWEL OR 17X24 6PK STRL BLUE (TOWEL DISPOSABLE) ×3 IMPLANT
TOWEL OR 17X26 10 PK STRL BLUE (TOWEL DISPOSABLE) ×3 IMPLANT
TUBE CONNECTING 12X1/4 (SUCTIONS) ×3 IMPLANT
UNDERPAD 30X30 INCONTINENT (UNDERPADS AND DIAPERS) ×3 IMPLANT
YANKAUER SUCT BULB TIP NO VENT (SUCTIONS) ×3 IMPLANT

## 2014-02-20 NOTE — Anesthesia Postprocedure Evaluation (Signed)
  Anesthesia Post-op Note  Patient: Alejandro Hill  Procedure(s) Performed: Procedure(s): HERNIA REPAIR UMBILICAL ADULT (N/A) INSERTION OF MESH (N/A) HEMORRHOIDECTOMY (N/A)  Patient Location: PACU  Anesthesia Type:General  Level of Consciousness: awake and alert   Airway and Oxygen Therapy: Patient Spontanous Breathing  Post-op Pain: mild  Post-op Assessment: Post-op Vital signs reviewed  Post-op Vital Signs: stable  Last Vitals:  Filed Vitals:   02/20/14 1245  BP: 139/76  Pulse: 82  Temp:   Resp: 14    Complications: No apparent anesthesia complications

## 2014-02-20 NOTE — Interval H&P Note (Signed)
History and Physical Interval Note:  02/20/2014 7:18 AM  Alejandro Hill  has presented today for surgery, with the diagnosis of Umbilical Hernia and hemorrhoids. The various methods of treatment have been discussed with the patient and family. After consideration of risks, benefits and other options for treatment, the patient has consented to  Procedure(s): HERNIA REPAIR UMBILICAL ADULT (N/A) INSERTION OF MESH (N/A) HEMORRHOIDECTOMY (N/A) as a surgical intervention .  The patient's history has been reviewed, patient examined, no change in status, stable for surgery.  I have reviewed the patient's chart and labs.  Questions were answered to the patient's satisfaction.     TOTH III,Tallan S

## 2014-02-20 NOTE — Progress Notes (Signed)
Patient present on floor from PACU.  Report received from [facility], [place]. Patient stable. Family at bedside.

## 2014-02-20 NOTE — H&P (Signed)
Alejandro Hill 02/01/2014 1:53 PM Location: [facility] Patient #: [id number] DOB: 1955-12-26 Married / Language: English / Race: White Male  History of Present Illness Alejandro Hill. Marlou Starks MD; 02/01/2014 2:50 PM) Patient words: eval umb hernia & hems.  The patient is a 58 year old male who presents with abdominal swelling. We are asked to see the patient in consultation by Dr. Sharlene Motts to evaluate him for hemorrhoids and an umbilical hernia. The patient is a 58 year old white male who has been having problems both with a painful umbilical hernia and bleeding hemorrhoids for the last year to year and a half. He denies any nausea or vomiting. He denies any fevers or chills. The umbilical hernia hurts him when it pops in and out. The hemorrhoids flare up about 3 times a year and he notices significant pain and bleeding associated with them.   Other Problems Alejandro Hill, CMA; 02/01/2014 1:56 PM) Asthma Hemorrhoids High blood pressure Kidney Stone Migraine Headache Sleep Apnea Thyroid Disease  Past Surgical History Alejandro Hill, CMA; 02/01/2014 1:56 PM) Cataract Surgery Bilateral. Hemorrhoidectomy Tonsillectomy Vasectomy  Diagnostic Studies History Alejandro Hill, CMA; 02/01/2014 1:56 PM) Colonoscopy 1-5 years ago  Allergies Alejandro Hill, CMA; 02/01/2014 1:54 PM) Penicillamine *ASSORTED CLASSES*  Medication History (Sonya Hill, CMA; 02/01/2014 1:56 PM) Albuterol Sulfate (2MG  Tablet, Oral) Active. Lisinopril-Hydrochlorothiazide (20-25MG  Tablet, Oral) Active. MetFORMIN HCl (500MG  Tablet, Oral) Active. Singulair (10MG  Tablet, Oral) Active. Multivitamins (Oral) Active. Fish Oil Active. Cialis (20MG  Tablet, Oral) Active. Verapamil HCl ER (240MG  Tablet ER, Oral) Active. Garlic (Oral) Active. Probiotic (Oral) Active.  Social History Alejandro Hill, [place]; 02/01/2014 1:56 PM) Alcohol use Occasional alcohol use. Caffeine use Coffee, Tea. No drug use Tobacco use  Never smoker.  Family History Alejandro Hill, CMA; 02/01/2014 1:56 PM) Hypertension Father. Ischemic Bowel Disease Mother. Respiratory Condition Father. Thyroid problems Sister.  Review of Systems ([place]; 02/01/2014 1:56 PM) General Present- Chills and Night Sweats. Not Present- Appetite Loss, Fatigue, Fever, Weight Gain and Weight Loss. Skin Not Present- Change in Wart/Mole, Dryness, Hives, Jaundice, New Lesions, Non-Healing Wounds, Rash and Ulcer. HEENT Present- Seasonal Allergies and Sinus Pain. Not Present- Earache, Hearing Loss, Hoarseness, Nose Bleed, Oral Ulcers, Ringing in the Ears, Sore Throat, Visual Disturbances, Wears glasses/contact lenses and Yellow Eyes. Respiratory Present- Snoring and Wheezing. Not Present- Bloody sputum, Chronic Cough and Difficulty Breathing. Cardiovascular Present- Difficulty Breathing Lying Down and Shortness of Breath. Not Present- Chest Pain, Leg Cramps, Palpitations, Rapid Heart Rate and Swelling of Extremities. Gastrointestinal Present- Change in Bowel Habits, Hemorrhoids and Nausea. Not Present- Abdominal Pain, Bloating, Bloody Stool, Chronic diarrhea, Constipation, Difficulty Swallowing, Excessive gas, Gets full quickly at meals, Indigestion, Rectal Pain and Vomiting. Male Genitourinary Not Present- Blood in Urine, Change in Urinary Stream, Frequency, Impotence, Nocturia, Painful Urination, Urgency and Urine Leakage. Musculoskeletal Present- Muscle Pain. Not Present- Back Pain, Joint Pain, Joint Stiffness, Muscle Weakness and Swelling of Extremities. Neurological Present- Headaches. Not Present- Decreased Memory, Fainting, Numbness, Seizures, Tingling, Tremor, Trouble walking and Weakness. Psychiatric Not Present- Anxiety, Bipolar, Change in Sleep Pattern, Depression, Fearful and Frequent crying. Endocrine Not Present- Cold Intolerance, Excessive Hunger, Hair Changes, Heat Intolerance, Hot flashes and New Diabetes. Hematology Not Present- Easy  Bruising, Excessive bleeding, Gland problems, HIV and Persistent Infections.   Vitals (Sonya Hill CMA; 02/01/2014 1:57 PM) 02/01/2014 1:57 PM Weight: 226 lb Height: 65in Body Surface Area: 2.17 m Body Mass Index: 37.61 kg/m Temp.: 71F(Temporal)  Pulse: 72 (Regular)  BP: 136/78 (Sitting, Left Arm, Standard)  Physical Exam [name] S. Marlou Starks MD; 02/01/2014 2:51 PM) General Mental Status-Alert. General Appearance-Consistent with stated age. Hydration-Well hydrated. Voice-Normal.  Head and Neck Head-normocephalic, atraumatic with no lesions or palpable masses. Trachea-midline. Thyroid Gland Characteristics - normal size and consistency.  Eye Eyeball - Bilateral-Extraocular movements intact. Sclera/Conjunctiva - Bilateral-No scleral icterus.  Chest and Lung Exam Chest and lung exam reveals -quiet, even and easy respiratory effort with no use of accessory muscles and on auscultation, normal breath sounds, no adventitious sounds and normal vocal resonance. Inspection Chest Wall - Normal. Back - normal.  Cardiovascular Cardiovascular examination reveals -normal heart sounds, regular rate and rhythm with no murmurs and normal pedal pulses bilaterally.  Abdomen Note: The abdomen is soft and nontender. There is a small moderate-sized umbilical hernia that reduces easily. The hernia is painful to palpation.   Rectal Note: On inspection there is moderate-sized external hemorrhoids that are not acutely thrombosed or inflamed today. On digital exam he has good rectal tone. On anoscopic exam he has moderate sized internal hemorrhoids with some irritation which are most likely the source of his bleeding.   Neurologic Neurologic evaluation reveals -alert and oriented x 3 with no impairment of recent or remote memory. Mental Status-Normal.  Musculoskeletal Normal Exam - Left-Upper Extremity Strength Normal and Lower Extremity Strength  Normal. Normal Exam - Right-Upper Extremity Strength Normal and Lower Extremity Strength Normal.  Lymphatic Head & Neck  General Head & Neck Lymphatics: Bilateral - Description - Normal. Axillary  General Axillary Region: Bilateral - Description - Normal. Tenderness - Non Tender. Femoral & Inguinal  Generalized Femoral & Inguinal Lymphatics: Bilateral - Description - Normal. Tenderness - Non Tender.    Assessment & Plan [name] S. Marlou Starks MD; 02/01/2014 2:48 PM) INTERNAL AND EXTERNAL BLEEDING HEMORRHOIDS (982.6  E15.8) UMBILICAL HERNIA (309.4  M76.8) UMBILICAL HERNIA WITHOUT OBSTRUCTION AND WITHOUT GANGRENE (553.1  K42.9) Impression: The patient appears to have 2 problems. First he has an umbilical hernia that is symptomatic. Because of the risk of incarceration and strenuous and I think he would benefit from having this fixed. I have discussed with him in detail the risks and benefits of the operation fixed the hernia as well as some of the technical aspects and he understands and wishes to proceed. Secondly he has bleeding internal and external hemorrhoids. I think he would benefit from a standard sort of hemorrhoidectomy. Again I have discussed with him in detail the risks and benefits of the operation and do this as well as some of the technical aspects and he understands and wishes to proceed. I will plan for an umbilical hernia repair with possible mesh and most likely a 2 column hemorrhoidectomy.     Signed by Luella Cook, MD (02/01/2014 2:51 PM)

## 2014-02-20 NOTE — Transfer of Care (Signed)
Immediate Anesthesia Transfer of Care Note  Patient: Alejandro Hill  Procedure(s) Performed: Procedure(s): HERNIA REPAIR UMBILICAL ADULT (N/A) INSERTION OF MESH (N/A) HEMORRHOIDECTOMY (N/A)  Patient Location: PACU  Anesthesia Type:General  Level of Consciousness: awake and alert   Airway & Oxygen Therapy: Patient Spontanous Breathing and Patient connected to nasal cannula oxygen  Post-op Assessment: Report given to PACU RN and Post -op Vital signs reviewed and stable  Post vital signs: Reviewed and stable  Complications: No apparent anesthesia complications

## 2014-02-20 NOTE — Anesthesia Preprocedure Evaluation (Addendum)
Anesthesia Evaluation  Patient identified by MRN, date of birth, ID band Patient awake    Reviewed: Allergy & Precautions, H&P , NPO status   Airway Mallampati: II       Dental  (+) Teeth Intact, Dental Advisory Given   Pulmonary shortness of breath, asthma , sleep apnea ,  Has CPAP ,doesnt use much breath sounds clear to auscultation        Cardiovascular hypertension, + Valvular Problems/Murmurs Rhythm:Regular Rate:Normal     Neuro/Psych  Headaches,    GI/Hepatic negative GI ROS, Neg liver ROS,   Endo/Other  diabetesMorbid obesity  Renal/GU      Musculoskeletal   Abdominal (+) + obese,   Peds  Hematology  (+) Blood dyscrasia, , Hct 50   Anesthesia Other Findings   Reproductive/Obstetrics                            Anesthesia Physical Anesthesia Plan  ASA: III  Anesthesia Plan: General   Post-op Pain Management:    Induction: Intravenous  Airway Management Planned: Oral ETT  Additional Equipment:   Intra-op Plan:   Post-operative Plan: Extubation in OR  Informed Consent: I have reviewed the patients History and Physical, chart, labs and discussed the procedure including the risks, benefits and alternatives for the proposed anesthesia with the patient or authorized representative who has indicated his/her understanding and acceptance.   Dental advisory given  Plan Discussed with: CRNA and Surgeon  Anesthesia Plan Comments:         Anesthesia Quick Evaluation

## 2014-02-20 NOTE — Progress Notes (Signed)
Report given to Chrisann Melaragno rn as caregiver 
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
## 2014-02-21 ENCOUNTER — Encounter (HOSPITAL_COMMUNITY): Payer: Self-pay | Admitting: General Practice

## 2014-02-21 DIAGNOSIS — K429 Umbilical hernia without obstruction or gangrene: Secondary | ICD-10-CM | POA: Diagnosis not present

## 2014-02-21 MED ORDER — TAMSULOSIN HCL 0.4 MG PO CAPS
0.4000 mg | ORAL_CAPSULE | Freq: Every day | ORAL | Status: DC
  Administered 2014-02-21: 0.4 mg via ORAL
  Filled 2014-02-21: qty 1

## 2014-02-21 NOTE — Progress Notes (Signed)
Discussed discharge summary with patient. Reviewed all medications with patient. Patient was able to void 300cc urine. Patient ready for discharge.

## 2014-02-21 NOTE — Discharge Summary (Signed)
Physician Discharge Summary  Patient ID: Alejandro Hill MRN: [id number] DOB/AGE: 60-09-1955 60 y.o.  Admit date: 02/20/2014 Discharge date: 02/21/2014  Admission Diagnoses:  Discharge Diagnoses:  Active Problems:   Hemorrhoids, internal, with bleeding   Discharged Condition: good  Hospital Course: the pt underwent umbilical hernia repair with mesh and hemorrhoidectomy. He had a lot of bleeding during the hemorrhoidectomy so he was kept overnight to observe. He had minimal bleeding overnight. He also had urinary retention requiring a foley. We will give him a voiding trial today and if he can void then he will be discharged  Consults: None  Significant Diagnostic Studies: none  Treatments: surgery: as above  Discharge Exam: Blood pressure 122/66, pulse 68, temperature 99.3 F (37.4 C), temperature source Oral, resp. rate 18, SpO2 94 %. Resp: clear to auscultation bilaterally Cardio: regular rate and rhythm GI: soft, mild tenderness  Disposition: 01-Home or Self Care  Discharge Instructions    Call MD for:  difficulty breathing, headache or visual disturbances    Complete by:  As directed      Call MD for:  extreme fatigue    Complete by:  As directed      Call MD for:  hives    Complete by:  As directed      Call MD for:  persistant dizziness or light-headedness    Complete by:  As directed      Call MD for:  persistant nausea and vomiting    Complete by:  As directed      Call MD for:  redness, tenderness, or signs of infection (pain, swelling, redness, odor or green/yellow discharge around incision site)    Complete by:  As directed      Call MD for:  severe uncontrolled pain    Complete by:  As directed      Call MD for:  temperature >100.4    Complete by:  As directed      Diet - low sodium heart healthy    Complete by:  As directed      Discharge instructions    Complete by:  As directed   May shower. Warm tub soaks twice a day. Use baby wipes after bm's. Use  dibucaine ointment as needed. Liquid diet for next couple days then advance to soft. Use colace and miralax to avoid constipation.     Increase activity slowly    Complete by:  As directed             Medication List    TAKE these medications        albuterol 108 (90 BASE) MCG/ACT inhaler  Commonly known as:  PROVENTIL HFA;VENTOLIN HFA  Inhale 2 puffs into the lungs every 6 (six) hours as needed for wheezing or shortness of breath. Luten     Cinnamon 500 MG Tabs  Take 2 tablets by mouth daily.     Garlic 542 MG Caps  Take 2 capsules by mouth daily.     HYDROcodone-acetaminophen 5-325 MG per tablet  Commonly known as:  NORCO  Take 1-2 tablets by mouth every 6 (six) hours as needed for severe pain.     lisinopril-hydrochlorothiazide 20-25 MG per tablet  Commonly known as:  PRINZIDE,ZESTORETIC  Take 1 tablet by mouth daily.     LYSINE PO  Take 1 tablet by mouth daily.     metFORMIN 500 MG (MOD) 24 hr tablet  Commonly known as:  GLUMETZA  Take 2 tablets (1,000 mg total) by  mouth daily with breakfast.     montelukast 10 MG tablet  Commonly known as:  SINGULAIR  Take 1 tablet (10 mg total) by mouth daily.     multivitamin tablet  Take 1 tablet by mouth daily.     NON FORMULARY  40 mg daily.     NON FORMULARY  5 mcg daily. Sea Kelp     OMEGA 3 PO  Take 2 tablets by mouth daily. Omega 3-6-9     PROBIOTIC DAILY PO  Take 1 capsule by mouth daily.     RED YEAST RICE PO  Take 1 capsule by mouth 2 (two) times daily.     tadalafil 20 MG tablet  Commonly known as:  CIALIS  Take 1 tablet (20 mg total) by mouth daily as needed for erectile dysfunction.     testosterone cypionate 200 MG/ML injection  Commonly known as:  DEPOTESTOTERONE CYPIONATE  Inject 0.5 mLs into the muscle once a week.     verapamil 240 MG (CO) 24 hr tablet  Commonly known as:  COVERA HS  Take 1 tablet (240 mg total) by mouth daily.     VITAMIN D-3 PO  Take 10,000 Units by mouth daily.            Follow-up Information    Follow up with TOTH III,Tomer S, MD In 3 weeks.   Specialty:  General Surgery   Contact information:   [address] [place] [address] [phone]       Signed: VALE, MOUSSEAU 02/21/2014, 8:52 AM

## 2014-02-21 NOTE — Progress Notes (Addendum)
1 Day Post-Op  Subjective: Urinary retention overnight. Complains of soreness  Objective: Vital signs in last 24 hours: Temp:  [97.8 F (36.6 C)-99.3 F (37.4 C)] 99.3 F (37.4 C) (11/24 0515) Pulse Rate:  [68-89] 68 (11/24 0515) Resp:  [12-19] 18 (11/24 0515) BP: (122-160)/(52-83) 122/66 mmHg (11/24 0515) SpO2:  [93 %-96 %] 94 % (11/24 0515) Last BM Date: 02/20/14  Intake/Output from previous day: 11/23 0701 - 11/24 0700 In: 2539 [P.O.:220; I.V.:2319] Out: 1300 [Urine:1200; Blood:100] Intake/Output this shift:    Resp: clear to auscultation bilaterally Cardio: regular rate and rhythm GI: soft, mild tenderness near incision  Lab Results:  No results for input(s): WBC, HGB, HCT, PLT in the last 72 hours. BMET No results for input(s): NA, K, CL, CO2, GLUCOSE, BUN, CREATININE, CALCIUM in the last 72 hours. PT/INR No results for input(s): LABPROT, INR in the last 72 hours. ABG No results for input(s): PHART, HCO3 in the last 72 hours.  Invalid input(s): PCO2, PO2  Studies/Results: No results found.  Anti-infectives: Anti-infectives    Start     Dose/Rate Route Frequency Ordered Stop   02/19/14 1146  ciprofloxacin (CIPRO) IVPB 400 mg  Status:  Discontinued     400 mg200 mL/hr over 60 Minutes Intravenous On call to O.R. 02/19/14 1146 02/20/14 1348      Assessment/Plan: s/p Procedure(s): HERNIA REPAIR UMBILICAL ADULT (N/A) INSERTION OF MESH (N/A) HEMORRHOIDECTOMY (N/A) Discharge  Will d/c foley and give dose of flomax  LOS: 1 day    TOTH III,Kemp S 02/21/2014

## 2014-02-22 ENCOUNTER — Other Ambulatory Visit: Payer: Self-pay | Admitting: Family Medicine

## 2014-11-02 ENCOUNTER — Other Ambulatory Visit: Payer: Self-pay | Admitting: Family Medicine

## 2014-11-20 ENCOUNTER — Encounter: Payer: Self-pay | Admitting: Gastroenterology

## 2015-01-25 ENCOUNTER — Other Ambulatory Visit: Payer: Self-pay | Admitting: Family Medicine

## 2015-01-26 NOTE — Telephone Encounter (Signed)
Call in #30 only, he needs an OV soon  

## 2015-01-29 NOTE — Telephone Encounter (Signed)
Pt did get on schedule, per Dr. Sarajane Jews okay to call in a 90 day supply.

## 2015-02-14 ENCOUNTER — Other Ambulatory Visit: Payer: Self-pay | Admitting: Family Medicine

## 2015-03-16 ENCOUNTER — Encounter: Payer: 59 | Admitting: Family Medicine

## 2015-03-24 ENCOUNTER — Other Ambulatory Visit: Payer: Self-pay | Admitting: Family Medicine

## 2015-04-25 ENCOUNTER — Encounter: Payer: Self-pay | Admitting: Family Medicine

## 2015-04-25 ENCOUNTER — Ambulatory Visit (INDEPENDENT_AMBULATORY_CARE_PROVIDER_SITE_OTHER): Payer: BLUE CROSS/BLUE SHIELD | Admitting: Family Medicine

## 2015-04-25 VITALS — BP 149/77 | HR 78 | Temp 98.7°F | Ht 66.0 in | Wt 241.0 lb

## 2015-04-25 DIAGNOSIS — E119 Type 2 diabetes mellitus without complications: Secondary | ICD-10-CM | POA: Diagnosis not present

## 2015-04-25 DIAGNOSIS — Z0001 Encounter for general adult medical examination with abnormal findings: Secondary | ICD-10-CM | POA: Diagnosis not present

## 2015-04-25 DIAGNOSIS — R7989 Other specified abnormal findings of blood chemistry: Secondary | ICD-10-CM

## 2015-04-25 DIAGNOSIS — J209 Acute bronchitis, unspecified: Secondary | ICD-10-CM

## 2015-04-25 DIAGNOSIS — I10 Essential (primary) hypertension: Secondary | ICD-10-CM | POA: Diagnosis not present

## 2015-04-25 DIAGNOSIS — Z0000 Encounter for general adult medical examination without abnormal findings: Secondary | ICD-10-CM

## 2015-04-25 LAB — LIPID PANEL
Cholesterol: 191 mg/dL (ref 0–200)
HDL: 43 mg/dL (ref >39.00)
NonHDL: 147.66
Total CHOL/HDL Ratio: 4
Triglycerides: 278 mg/dL — ABNORMAL HIGH (ref 0.0–149.0)
VLDL: 55.6 mg/dL — ABNORMAL HIGH (ref 0.0–40.0)

## 2015-04-25 LAB — CBC WITH DIFFERENTIAL/PLATELET
BASOS PCT: 0.2 % (ref 0.0–3.0)
Basophils Absolute: 0 10*3/uL (ref 0.0–0.1)
Eosinophils Absolute: 0.1 10*3/uL (ref 0.0–0.7)
Eosinophils Relative: 1.1 % (ref 0.0–5.0)
HEMATOCRIT: 49 % (ref 39.0–52.0)
Hemoglobin: 16.5 g/dL (ref 13.0–17.0)
LYMPHS PCT: 9.4 % — AB (ref 12.0–46.0)
Lymphs Abs: 0.9 10*3/uL (ref 0.7–4.0)
MCHC: 33.7 g/dL (ref 30.0–36.0)
MCV: 91.1 fl (ref 78.0–100.0)
Monocytes Absolute: 0.7 10*3/uL (ref 0.1–1.0)
Monocytes Relative: 7.6 % (ref 3.0–12.0)
NEUTROS ABS: 7.4 10*3/uL (ref 1.4–7.7)
Neutrophils Relative %: 81.7 % — ABNORMAL HIGH (ref 43.0–77.0)
PLATELETS: 255 10*3/uL (ref 150.0–400.0)
RBC: 5.38 Mil/uL (ref 4.22–5.81)
RDW: 14 % (ref 11.5–15.5)
WBC: 9.1 10*3/uL (ref 4.0–10.5)

## 2015-04-25 LAB — POCT URINALYSIS DIPSTICK
BILIRUBIN UA: NEGATIVE
Blood, UA: NEGATIVE
Glucose, UA: NEGATIVE
KETONES UA: NEGATIVE
Leukocytes, UA: NEGATIVE
Nitrite, UA: NEGATIVE
PH UA: 8
Spec Grav, UA: 1.02
Urobilinogen, UA: 0.2

## 2015-04-25 LAB — HEPATIC FUNCTION PANEL
ALBUMIN: 4.6 g/dL (ref 3.5–5.2)
ALT: 34 U/L (ref 0–53)
AST: 26 U/L (ref 0–37)
Alkaline Phosphatase: 79 U/L (ref 39–117)
Bilirubin, Direct: 0.1 mg/dL (ref 0.0–0.3)
Total Bilirubin: 0.7 mg/dL (ref 0.2–1.2)
Total Protein: 7.1 g/dL (ref 6.0–8.3)

## 2015-04-25 LAB — TSH: TSH: 0.97 u[IU]/mL (ref 0.35–4.50)

## 2015-04-25 LAB — BASIC METABOLIC PANEL
BUN: 13 mg/dL (ref 6–23)
CO2: 32 meq/L (ref 19–32)
Calcium: 9.3 mg/dL (ref 8.4–10.5)
Chloride: 100 mEq/L (ref 96–112)
Creatinine, Ser: 0.97 mg/dL (ref 0.40–1.50)
GFR: 83.44 mL/min (ref >60.00)
GLUCOSE: 104 mg/dL — AB (ref 70–99)
POTASSIUM: 4.4 meq/L (ref 3.5–5.1)
SODIUM: 141 meq/L (ref 135–145)

## 2015-04-25 LAB — HEMOGLOBIN A1C: Hgb A1c MFr Bld: 5.4 % (ref 4.6–6.5)

## 2015-04-25 LAB — LDL CHOLESTEROL, DIRECT: Direct LDL: 118 mg/dL

## 2015-04-25 MED ORDER — VERAPAMIL HCL ER 240 MG PO TBCR: 240.0000 mg | EXTENDED_RELEASE_TABLET | Freq: Every day | ORAL | Status: DC

## 2015-04-25 MED ORDER — FLUOXETINE HCL 20 MG PO CAPS: ORAL_CAPSULE | ORAL | Status: DC

## 2015-04-25 MED ORDER — CLARITHROMYCIN 500 MG PO TABS: 500.0000 mg | ORAL_TABLET | Freq: Two times a day (BID) | ORAL | Status: DC

## 2015-04-25 MED ORDER — METFORMIN HCL ER 500 MG PO TB24: ORAL_TABLET | ORAL | Status: DC

## 2015-04-25 MED ORDER — ALBUTEROL SULFATE (2.5 MG/3ML) 0.083% IN NEBU: 2.5000 mg | INHALATION_SOLUTION | RESPIRATORY_TRACT | Status: DC | PRN

## 2015-04-25 MED ORDER — ALBUTEROL SULFATE HFA 108 (90 BASE) MCG/ACT IN AERS: 2.0000 | INHALATION_SPRAY | RESPIRATORY_TRACT | Status: DC | PRN

## 2015-04-25 NOTE — Progress Notes (Signed)
Pre visit review using our clinic review tool, if applicable. No additional management support is needed unless otherwise documented below in the visit note. 

## 2015-04-25 NOTE — Progress Notes (Signed)
   Subjective:    Patient ID: Alejandro Hill, male    DOB: 1955-08-24, 60 y.o.   MRN: [id number]  HPI 60 yr old male for a cpx. He also mentions that he has been sick for the past 2 weeks with chest congestion, wheezing, and coughing up green sputum. No fever. Using Mucinex and his inhaler. He has a nebulizer at home but has not needed to use this for some time. Prior to this illness he has felt good. His BP at home has been stable in the range of 120s over 80s. He has not checked his glucoses for a long time.   Review of Systems  Constitutional: Negative.   HENT: Negative.   Eyes: Negative.   Respiratory: Positive for cough, chest tightness, shortness of breath and wheezing. Negative for apnea, choking and stridor.   Cardiovascular: Negative.   Gastrointestinal: Negative.   Genitourinary: Negative.   Musculoskeletal: Negative.   Skin: Negative.   Neurological: Negative.   Psychiatric/Behavioral: Negative.        Objective:   Physical Exam  Constitutional: He is oriented to person, place, and time. He appears well-developed and well-nourished. No distress.  HENT:  Head: Normocephalic and atraumatic.  Right Ear: External ear normal.  Left Ear: External ear normal.  Nose: Nose normal.  Mouth/Throat: Oropharynx is clear and moist. No oropharyngeal exudate.  Eyes: Conjunctivae and EOM are normal. Pupils are equal, round, and reactive to light. Right eye exhibits no discharge. Left eye exhibits no discharge. No scleral icterus.  Neck: Neck supple. No JVD present. No tracheal deviation present. No thyromegaly present.  Cardiovascular: Normal rate, regular rhythm, normal heart sounds and intact distal pulses.  Exam reveals no gallop and no friction rub.   No murmur heard. EKG normal   Pulmonary/Chest: Effort normal. No respiratory distress. He has no rales. He exhibits no tenderness.  Scattered wheezes and rhonchi   Abdominal: Soft. Bowel sounds are normal. He exhibits no distension and  no mass. There is no tenderness. There is no rebound and no guarding.  Genitourinary: Rectum normal, prostate normal and penis normal. Guaiac negative stool. No penile tenderness.  Musculoskeletal: Normal range of motion. He exhibits no edema or tenderness.  Lymphadenopathy:    He has no cervical adenopathy.  Neurological: He is alert and oriented to person, place, and time. He has normal reflexes. No cranial nerve deficit. He exhibits normal muscle tone. Coordination normal.  Skin: Skin is warm and dry. No rash noted. He is not diaphoretic. No erythema. No pallor.  Psychiatric: He has a normal mood and affect. His behavior is normal. Judgment and thought content normal.          Assessment & Plan:  Wellness exam. We will get fasting labs today including an A1c. For the bronchitis, treat with Biaxin. We discussed diet and exercise. He knows he needs to lose weight.

## 2015-04-26 ENCOUNTER — Encounter: Payer: Self-pay | Admitting: *Deleted

## 2015-04-28 ENCOUNTER — Other Ambulatory Visit: Payer: Self-pay | Admitting: Family Medicine

## 2015-05-02 ENCOUNTER — Telehealth: Payer: Self-pay | Admitting: Family Medicine

## 2015-05-02 NOTE — Telephone Encounter (Signed)
Patient has appt tomorrow with Dr. Sarajane Jews - [facility]

## 2015-05-02 NOTE — Telephone Encounter (Signed)
Patient Name: Alejandro Hill  DOB: [birth year]    Initial Comment Caller states he's coughing a lot. He's having shortness of breath. DX- Bronchitis. He has two more days of antibiotic left, not getting any better.   Nurse Assessment  Nurse: Mallie Mussel, RN, Alveta Heimlich Date/Time Eilene Ghazi Time): 05/02/2015 2:23:16 PM  Confirm and document reason for call. If symptomatic, describe symptoms. You must click the next button to save text entered. ---Caller states that he was seen last week and diagnosed with bronchitis. He was prescribed Clarithromycin but he doesn't seem to be getting any better. He's not getting worse, but doesn't seem to be getting much better. He says he has SOB with exertion. He is talking in complete sentences.  Has the patient traveled out of the country within the last 30 days? ---No  Does the patient have any new or worsening symptoms? ---Yes  Will a triage be completed? ---Yes  Related visit to physician within the last 2 weeks? ---Yes  Does the PT have any chronic conditions? (i.e. diabetes, asthma, etc.) ---No  Is this a behavioral health or substance abuse call? ---No     Guidelines    Guideline Title Affirmed Question Affirmed Notes  Breathing Difficulty [1] MILD longstanding difficulty breathing AND [2] SAME as normal    Final Disposition User   See PCP When Office is Open (within 3 days) Mallie Mussel, RN, Alveta Heimlich    Comments  Appointment scheduled for 10:45am tomorrow with Dr. Alysia Penna.   Referrals  REFERRED TO PCP OFFICE   Disagree/Comply: Comply

## 2015-05-03 ENCOUNTER — Ambulatory Visit (INDEPENDENT_AMBULATORY_CARE_PROVIDER_SITE_OTHER): Payer: BLUE CROSS/BLUE SHIELD | Admitting: Family Medicine

## 2015-05-03 ENCOUNTER — Encounter: Payer: Self-pay | Admitting: Family Medicine

## 2015-05-03 VITALS — BP 150/78 | HR 71 | Temp 98.9°F | Ht 66.0 in | Wt 241.0 lb

## 2015-05-03 DIAGNOSIS — J209 Acute bronchitis, unspecified: Secondary | ICD-10-CM

## 2015-05-03 MED ORDER — PREDNISONE 10 MG PO TABS: ORAL_TABLET | ORAL | Status: DC

## 2015-05-03 MED ORDER — LEVOFLOXACIN 500 MG PO TABS: 500.0000 mg | ORAL_TABLET | Freq: Every day | ORAL | Status: AC

## 2015-05-03 MED ORDER — HYDROCODONE-HOMATROPINE 5-1.5 MG/5ML PO SYRP: 5.0000 mL | ORAL_SOLUTION | ORAL | Status: DC | PRN

## 2015-05-03 NOTE — Progress Notes (Signed)
   Subjective:    Patient ID: Alejandro Hill, male    DOB: Jul 07, 1955, 60 y.o.   MRN: [id number]  HPI Here for worsening cough, wheezing and SOB. He has been taking Biaxin for 5 days with no response. The cough produces green sputum. Using Nyquil and his nebulizer.    Review of Systems  Constitutional: Negative.   HENT: Negative.   Eyes: Negative.   Respiratory: Positive for cough, chest tightness, shortness of breath and wheezing.   Cardiovascular: Negative.        Objective:   Physical Exam  Constitutional: He appears well-developed and well-nourished.  HENT:  Right Ear: External ear normal.  Left Ear: External ear normal.  Nose: Nose normal.  Mouth/Throat: Oropharynx is clear and moist.  Eyes: Conjunctivae are normal.  Neck: No thyromegaly present.  Cardiovascular: Normal rate, regular rhythm, normal heart sounds and intact distal pulses.   Pulmonary/Chest: Effort normal. He has no rales.  Scattered rhonchi and wheezes   Lymphadenopathy:    He has no cervical adenopathy.          Assessment & Plan:  Partially treated bronchitis. Switch to Levaquin and add a prednisone taper.

## 2015-05-03 NOTE — Progress Notes (Signed)
Pre visit review using our clinic review tool, if applicable. No additional management support is needed unless otherwise documented below in the visit note. 

## 2015-06-28 ENCOUNTER — Other Ambulatory Visit: Payer: Self-pay | Admitting: Family Medicine

## 2015-06-28 NOTE — Telephone Encounter (Signed)
Call this in for a 90 day supply and one rf. He takes 0.5 ml weekly

## 2015-06-28 NOTE — Telephone Encounter (Signed)
Pt request refill  testosterone cypionate (DEPOTESTOTERONE CYPIONATE) 200 MG/ML injection  [facility] [phone]

## 2015-06-29 MED ORDER — TESTOSTERONE CYPIONATE 200 MG/ML IM SOLN: 100.0000 mg | INTRAMUSCULAR | Status: DC

## 2015-06-29 NOTE — Telephone Encounter (Signed)
Rx was sent and Faxed to the pharmacy

## 2015-09-03 ENCOUNTER — Other Ambulatory Visit: Payer: Self-pay | Admitting: *Deleted

## 2015-09-03 ENCOUNTER — Telehealth: Payer: Self-pay | Admitting: Family Medicine

## 2015-09-03 NOTE — Telephone Encounter (Signed)
Call in a Zpack  ?

## 2015-09-03 NOTE — Telephone Encounter (Signed)
Pt states he has congestion, eyes hurt, coughing up green phlegm. Pt states not in his chest yet. But his head is full and pt gets dizzy when he bends over. Pt would like a zpak called in.  Advised pt he needed appointment.  Pt states he prefers not to come in. Pt is trying to get life insurance and he does not want this flagged as  his last appointment was and this has delayed his policy. If pt cannot get a rx, pt would like to know what he can take OTC.  [facility]

## 2015-09-04 MED ORDER — AZITHROMYCIN 250 MG PO TABS: ORAL_TABLET | ORAL | Status: DC

## 2015-09-04 NOTE — Telephone Encounter (Signed)
LVM to return our call, Rx has been sent to pharmacy as requested

## 2015-10-24 ENCOUNTER — Telehealth: Payer: Self-pay | Admitting: Family Medicine

## 2015-10-24 NOTE — Telephone Encounter (Signed)
Patient is very upset and wants to talk about what he was charged for on his physical that wasn't supposed to be charged for.  He is getting a bill for something he don't understand.  The billing office said he needed to talk to Dr Sarajane Jews.

## 2015-10-24 NOTE — Telephone Encounter (Signed)
I spoke with the office manager here and she has taken care of this.

## 2016-01-10 ENCOUNTER — Telehealth: Payer: Self-pay | Admitting: Acute Care

## 2016-01-10 ENCOUNTER — Encounter: Payer: Self-pay | Admitting: Acute Care

## 2016-01-10 ENCOUNTER — Ambulatory Visit (INDEPENDENT_AMBULATORY_CARE_PROVIDER_SITE_OTHER): Payer: BLUE CROSS/BLUE SHIELD | Admitting: Acute Care

## 2016-01-10 VITALS — BP 138/78 | HR 69 | Ht 66.0 in | Wt 232.0 lb

## 2016-01-10 DIAGNOSIS — G4733 Obstructive sleep apnea (adult) (pediatric): Secondary | ICD-10-CM | POA: Diagnosis not present

## 2016-01-10 NOTE — Assessment & Plan Note (Signed)
Pt states he is compliant with CPAP. No SD card brought today for download. Needs new mask. Plan: Make sure the SD card is placed properly in the machine to enable [place] to get a down Load. Please bring your SD card to the office to allow [place] to get a download. We will place an order for equipment/ supplies.( Face Mask) Continue on CPAP at bedtime. You appear to be benefiting from the treatment Goal is to wear for at least 4-6 hours each night for maximal clinical benefit. Continue to work on weight loss, as the link between excess weight  and sleep apnea is well established.  Do not drive if sleepy. Follow up with Dr. Elsworth Soho  In 6 months or before as needed.  Please contact office for sooner follow up if symptoms do not improve or worsen or seek emergency care

## 2016-01-10 NOTE — Telephone Encounter (Signed)
Change pharmacy in patient's chart.  Nothing further needed.

## 2016-01-10 NOTE — Telephone Encounter (Signed)
Attempted to contact patient, no voicemail, will call back.

## 2016-01-10 NOTE — Patient Instructions (Addendum)
It is nice to see you today. Make sure the SD card is placed properly in the machine to enable [place] to get a down Load. Please bring your SD card to the office to allow [place] to get a download. We will place an order for equipment/ supplies.( Face Mask) Continue on CPAP at bedtime. You appear to be benefiting from the treatment Goal is to wear for at least 4-6 hours each night for maximal clinical benefit. Continue to work on weight loss, as the link between excess weight  and sleep apnea is well established.  Do not drive if sleepy. Follow up with Dr. Elsworth Soho  In 6 months or before as needed.  Please contact office for sooner follow up if symptoms do not improve or worsen or seek emergency care

## 2016-01-10 NOTE — Progress Notes (Signed)
History of Present Illness Alejandro Hill is a 60 y.o. male with OSA , former patient of Dr. Gwenette Greet .   01/10/2016 Office Visit for CPAP Supplies.  Pt presents to the office today for follow up of CPAP use. He was last seen in the office 10/2013.  He states he is doing well on the CPAP machine, but needs a new mask. He continues to see improvement in his daytime sleepiness.He is comfortable with the pressure settings.He states that his weight has been stable for the last year. He does want to continue to  lose weight. 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   Down Load from 1 year ago ( 01/2015)  does show significant leaks, but he is here specifically for new mask. 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 We will have him bring his SD card in for down Loads in the future as he is no longer being monitored by his DME.   Tests Last Down Load  Auto Set 7-20 cm H2O Median Pressure 13.5; Maximum 18.7  02/12/2015-03/13/2015 Usage Days: 20/30: ( 67%) > 4 hours: 18 days ( 60%) < 4 hours: 2 days Average Use days used 7 hours 30 minutes. AHI= 3.8   Past medical hx Past Medical History:  Diagnosis Date  . Anemia    as a child  . Asthma    exertional, seasonal (spring)  . Diabetes mellitus    type II diabetes. Sees Dr Chalmers Cater  . Heart murmur    as a child  . Hemorrhoids   . Hyperlipidemia   . Hypertension   . Hypogonadism male    sees Dr. Diona Fanti  . Kidney stone   . Migraine    cluster, [facility]  . Pneumonia 2012  . Seasonal allergies   . Sleep apnea    , uses cpap  . Thyroid disease    hypothyroidism - not taking medications except for [employer]  . Umbilical hernia      Past surgical hx, Family hx, Social  hx all reviewed.  Current Outpatient Prescriptions on File Prior to Visit  Medication Sig  . Cholecalciferol (VITAMIN D-3 PO) Take 10,000 Units by mouth daily.  . Cinnamon 500 MG TABS Take 2 tablets by mouth daily.   . Garlic 0000000 MG CAPS Take 2 capsules by mouth daily.   Marland Kitchen LYSINE PO Take 1 tablet by mouth daily.   . Multiple Vitamin (MULTIVITAMIN) tablet Take 1 tablet by mouth daily.  . NON FORMULARY 5 mcg daily. [employer]  . Omega-3 Fatty Acids (OMEGA 3 PO) Take 2 tablets by mouth daily. Omega 3-6-9  . Red Yeast Rice Extract (RED YEAST RICE PO) Take 1 capsule by mouth 2 (two) times daily.   Marland Kitchen testosterone cypionate (DEPOTESTOSTERONE CYPIONATE) 200 MG/ML injection Inject 0.5 mLs (100 mg total) into the muscle once a week.  . verapamil (CALAN-SR) 240 MG CR tablet Take 1 tablet (240 mg total) by mouth daily.   No current facility-administered medications on file prior to visit.      Allergies  Allergen Reactions  . Penicillins Rash    Review Of Systems:  Constitutional:   No  weight loss, night sweats,  Fevers, chills, fatigue, or  lassitude.  HEENT:   No headaches,  Difficulty swallowing,  Tooth/dental problems, or  Sore throat,                No sneezing, itching, ear ache, nasal congestion, post nasal drip,   CV:  No chest pain,  Orthopnea, PND, swelling in lower extremities, anasarca, dizziness, palpitations, syncope.   GI  No heartburn, indigestion, abdominal pain, nausea, vomiting, diarrhea, change in bowel habits, loss of appetite, bloody stools.   Resp: + shortness of breath with exertion not  at rest.  No excess mucus, no productive cough,  No non-productive cough,  No coughing up of blood.  No change in color of mucus.  No wheezing.  No chest wall deformity  Skin: no rash or lesions.  GU: no dysuria, change in color of urine, no urgency or frequency.  No flank pain, no hematuria   MS:  No joint pain or swelling.  No decreased range of motion.  No back pain.  Psych:   No change in mood or affect. No depression or anxiety.  No memory loss.   Vital Signs BP 138/78 (BP Location: Left Arm, Cuff Size: Normal)   Pulse 69   Ht 5\' 6"  (1.676 m)   Wt 232 lb (105.2 kg)   SpO2 96%   BMI 37.45 kg/m    Physical Exam:  General- No distress,  A&Ox3, obese ENT: No sinus tenderness, TM clear, pale nasal mucosa, no oral exudate,no post nasal drip, no LAN Cardiac: S1, S2, regular rate and rhythm, no murmur Chest: No wheeze/ rales/ diminished per bases bilaterally; no accessory muscle use, no nasal flaring, no sternal retractions Abd.: Soft Non-tender Ext: No clubbing cyanosis,trace edema Neuro:  normal strength Skin: No rashes, warm and dry Psych: normal mood and behavior   Assessment/Plan  OSA (obstructive sleep apnea) Pt states he is compliant with CPAP. No SD card brought today for download. Needs new mask. Plan: Make sure the SD card is placed properly in the machine to enable [place] to get a down Load. Please bring your SD card to the office to allow [place] to get a download. We will place an order for equipment/ supplies.( Face Mask) Continue on CPAP at bedtime. You appear to be benefiting from the treatment Goal is to wear for at least 4-6 hours each night for maximal clinical benefit. Continue to work on weight loss, as the link between excess weight  and sleep apnea is well established.  Do not drive if sleepy. Follow up with Dr. Elsworth Soho  In 6 months or before as needed.  Please contact office for sooner follow up if symptoms do not improve or worsen or seek emergency care      Magdalen Spatz, NP 01/10/2016  12:09 PM

## 2016-01-11 NOTE — Telephone Encounter (Signed)
Called and spoke with pt and he stated that Judson Roch was going to give him a couple of names of places that he could get cpap supplies discounted.  Pt stated that this was not in his paper work when he got home.  SG please advise. thanks

## 2016-01-11 NOTE — Telephone Encounter (Signed)
Please call the patient and let him know the only thing we discussed was getting supplies through his DME. I did not make any suggestions about discounted supplies. Thanks so much.

## 2016-01-11 NOTE — Progress Notes (Signed)
Reviewed & agree with plan  

## 2016-03-06 ENCOUNTER — Telehealth: Payer: Self-pay | Admitting: Pulmonary Disease

## 2016-03-06 NOTE — Telephone Encounter (Signed)
He has been stable on CPAP.  He can f/u with Dr. Elsworth Soho one year after last visit with Eric Form.

## 2016-03-06 NOTE — Telephone Encounter (Signed)
Dr Halford Chessman reviewed the chart and agree with the move to 12 month ov

## 2016-04-02 ENCOUNTER — Telehealth: Payer: Self-pay | Admitting: Family Medicine

## 2016-04-02 NOTE — Telephone Encounter (Signed)
[facility] - Client [facility] Patient Name: Alejandro Hill DOB: Sep 01, 1955 Initial Comment caller states he has shoulder pain Nurse Assessment Nurse: Jimmey Ralph, RN, Epifanio Lesches Date/Time (Eastern Time): 04/02/2016 3:24:57 PM Confirm and document reason for call. If symptomatic, describe symptoms. ---caller states he has shoulder pain. I started getting shoulder pain (right). I have hx of left shoulder pain and received cortisone shots two months ago and just received more on last thursday. My right shoulder pain is starting to hurt. I received shots at [facility] (walk in clinic). They told me it was Bone spurs. Does the patient have any new or worsening symptoms? ---Colbert Ewing a triage be completed? ---Yes Related visit to physician within the last 2 weeks? ---Yes Does the PT have any chronic conditions? (i.e. diabetes, asthma, etc.) ---No Is this a behavioral health or substance abuse call? ---No Guidelines Guideline Title Affirmed Question Affirmed Notes Shoulder Pain [1] MODERATE pain (e.g., interferes with normal activities) AND [2] present > 3 days Final Disposition User See PCP When Office is Open (within 3 days) Hammonds, RN, Lissa Comments Virapamil leftpain scale: 3/10 right pain scale: 5/10 Cyclobenziprine HCL 10mg  BID Meloxicam BID Tumeric ~ 900mg  (has bioprine/blackpepper) Caller states he can't afford related to deductible any expenses for Xrays. Caller is available for appointments Tomorrow or Friday between the hours 10am and 1pm. Made appt for 10:30 am - arrive 10:15 am on FRIDAY with Dorothyann Peng, Referrals REFERRED TO PCP OFFICE Disagree/Comply: Comply Call Id: [id number]

## 2016-04-02 NOTE — Telephone Encounter (Signed)
Noted  

## 2016-04-04 ENCOUNTER — Ambulatory Visit: Payer: Self-pay | Admitting: Adult Health

## 2016-05-14 ENCOUNTER — Telehealth: Payer: Self-pay | Admitting: Pulmonary Disease

## 2016-05-14 ENCOUNTER — Other Ambulatory Visit: Payer: Self-pay

## 2016-05-14 DIAGNOSIS — G4733 Obstructive sleep apnea (adult) (pediatric): Secondary | ICD-10-CM

## 2016-05-14 NOTE — Telephone Encounter (Signed)
Okay to switch Does he need appointment?

## 2016-05-14 NOTE — Telephone Encounter (Signed)
RA  Please Advise-  Pt. Was a former [facility] pt. Who saw SG on 01/10/16, who wanted him to follow up with you, according to phone note on 03/06/16 VS said it was fine for pt. To follow up with you in 1 year. Pt. States he has been having trouble getting supplies and with communication with [facility] and he contacted his insurance to find out what other DME he could switch to and they gave him [facility] in [place]. He wanted to know if this is ok to place an order to switch his dme

## 2016-05-14 NOTE — Telephone Encounter (Signed)
RA  Pt. Is on the recall list for a follow up appointment.    The order was placed.

## 2016-05-20 ENCOUNTER — Telehealth: Payer: Self-pay | Admitting: Pulmonary Disease

## 2016-05-20 DIAGNOSIS — G4733 Obstructive sleep apnea (adult) (pediatric): Secondary | ICD-10-CM

## 2016-05-20 NOTE — Telephone Encounter (Signed)
Spoke with Melissa at [facility]. This order has been placed.  I reached out to the pt to try to schedule his follow up with RA. At his last OV with SG, she wanted him to follow up in 6 months which would be in April 2018. He argued with me and was told that he wouldn't have to follow up until October 2018. Pt has not seen RA at all and we keep sending CPAP orders in under his name. Advised pt that he might need an appointment before October so he needed to prepare himself for that. Pt was rude through out this entire conversation. Nothing further was needed at this time.

## 2016-06-17 ENCOUNTER — Other Ambulatory Visit: Payer: Self-pay | Admitting: Family Medicine

## 2016-08-22 ENCOUNTER — Telehealth: Payer: Self-pay | Admitting: Family Medicine

## 2016-08-22 NOTE — Telephone Encounter (Signed)
° ° ° ° °  Pt call to say he had issues with labs last year and would like a call back to discuss what labs he had last year because he does not want to received any bills .    [phone]

## 2016-08-26 NOTE — Telephone Encounter (Signed)
Pt is due for a office visit and this needs to be discussed before we send him to the lab, he can schedule early morning appointment to see Dr. Sarajane Jews first and then will send him to the lab.

## 2016-08-27 NOTE — Telephone Encounter (Signed)
° ° ° ° °  Spoke with pt said she did not want to schedule an appt without knowing what labs are covered. Said his insurance company could not tell him what labs are preventive.

## 2016-09-14 ENCOUNTER — Other Ambulatory Visit: Payer: Self-pay | Admitting: Family Medicine

## 2016-09-15 ENCOUNTER — Telehealth: Payer: Self-pay | Admitting: Family Medicine

## 2016-09-15 NOTE — Telephone Encounter (Signed)
Pt calling wanting to let us know the exact labs that will be done on his visit so that he will not be stuck with a unneeded bill.  Pt would like to have a call back to discuss this in detail.

## 2016-09-15 NOTE — Telephone Encounter (Signed)
Pt has already been advised several times that he needs to be seen for a physical and then he can be given the appropriate codes for his visit. Nothing further needed.

## 2016-09-15 NOTE — Telephone Encounter (Signed)
Patient Name: Alejandro Hill  DOB: [birth year]    Initial Comment Trelyn Finan Jan 22, 1956 [phone], Verapamil can not be refilled without an office visit.-- He is not happy he is being transferred back band forth to speak w/ the nurse. -- he wants to stay on hold until he speaks with the nurse    Nurse Assessment  Nurse: Leilani Merl, RN, Nira Conn Date/Time (Eastern Time): 09/15/2016 2:40:39 PM  Confirm and document reason for call. If symptomatic, describe symptoms. ---Caller states that he wants all the doctor codes that was sent to the insurance company last year with his physical, since it is preventative, he does not want any additional charges, so can someone call him and give him the insurance codes or e-mail them to him. His e-mail address [email or web address]. He would also like his verapamil called in without having to see the doctor.  Does the patient have any new or worsening symptoms? ---No

## 2016-09-16 NOTE — Telephone Encounter (Signed)
Called and spoke with patient.  Advised that we cannot order labs until after his physical.  Explained to patient that lab dx depends on the patient's hx and individual needs.  Also let the patient know that if he has a hx of abnormal labs that those codes are usually different than screening codes.  Pt is coming in tomorrow fasting and acknowledged understanding.  Stated he will call his insurance from our lobby to get information regarding the codes being used for any CPE labs.

## 2016-09-17 ENCOUNTER — Ambulatory Visit (INDEPENDENT_AMBULATORY_CARE_PROVIDER_SITE_OTHER): Payer: BLUE CROSS/BLUE SHIELD | Admitting: Family Medicine

## 2016-09-17 ENCOUNTER — Encounter: Payer: Self-pay | Admitting: Family Medicine

## 2016-09-17 VITALS — BP 138/84 | Temp 98.3°F | Ht 66.0 in | Wt 234.0 lb

## 2016-09-17 DIAGNOSIS — M25511 Pain in right shoulder: Secondary | ICD-10-CM

## 2016-09-17 DIAGNOSIS — Z0000 Encounter for general adult medical examination without abnormal findings: Secondary | ICD-10-CM | POA: Diagnosis not present

## 2016-09-17 DIAGNOSIS — N138 Other obstructive and reflux uropathy: Secondary | ICD-10-CM

## 2016-09-17 DIAGNOSIS — N401 Enlarged prostate with lower urinary tract symptoms: Secondary | ICD-10-CM

## 2016-09-17 DIAGNOSIS — E782 Mixed hyperlipidemia: Secondary | ICD-10-CM

## 2016-09-17 DIAGNOSIS — I10 Essential (primary) hypertension: Secondary | ICD-10-CM | POA: Diagnosis not present

## 2016-09-17 DIAGNOSIS — E039 Hypothyroidism, unspecified: Secondary | ICD-10-CM

## 2016-09-17 NOTE — Progress Notes (Signed)
   Subjective:    Patient ID: Alejandro Hill, male    DOB: May 06, 1955, 61 y.o.   MRN: [id number]  HPI 61 yr old male for a well exam. His main concern is pain in the right shoulder. He has had pain in both shoulders for several years, but in the past year the right one has gotten much worse. He also has pain that radiates down the right arm to the hand, he feels numbness and tingling in the arm and hand, and he has some weakness of grip in the right hand. He went to another medical clinic and was prescribed Meloxicam and Flexeril, but these did not help. He had a cortisone injection to the right shoulder but this did not help either.    Review of Systems  HENT: Negative.   Eyes: Negative.   Respiratory: Negative.   Cardiovascular: Negative.   Gastrointestinal: Negative.   Genitourinary: Negative.   Musculoskeletal: Positive for arthralgias. Negative for back pain, gait problem, joint swelling, myalgias, neck pain and neck stiffness.  Skin: Negative.   Neurological: Positive for weakness and numbness. Negative for dizziness, tremors, seizures, syncope, facial asymmetry, speech difficulty, light-headedness and headaches.  Psychiatric/Behavioral: Negative.        Objective:   Physical Exam  Constitutional: He is oriented to person, place, and time. He appears well-developed and well-nourished. No distress.  Morbidly obese   HENT:  Head: Normocephalic and atraumatic.  Right Ear: External ear normal.  Left Ear: External ear normal.  Nose: Nose normal.  Mouth/Throat: Oropharynx is clear and moist. No oropharyngeal exudate.  Eyes: Conjunctivae and EOM are normal. Pupils are equal, round, and reactive to light. Right eye exhibits no discharge. Left eye exhibits no discharge. No scleral icterus.  Neck: Neck supple. No JVD present. No tracheal deviation present. No thyromegaly present.  Cardiovascular: Normal rate, regular rhythm, normal heart sounds and intact distal pulses.  Exam reveals no  gallop and no friction rub.   No murmur heard. Pulmonary/Chest: Effort normal and breath sounds normal. No respiratory distress. He has no wheezes. He has no rales. He exhibits no tenderness.  Abdominal: Soft. Bowel sounds are normal. He exhibits no distension and no mass. There is no tenderness. There is no rebound and no guarding.  Genitourinary: Rectum normal, prostate normal and penis normal. Rectal exam shows guaiac negative stool. No penile tenderness.  Musculoskeletal: He exhibits no edema.  The anterior right shoulder is very tender and ROM is reduced due to pain. No crepitus   Lymphadenopathy:    He has no cervical adenopathy.  Neurological: He is alert and oriented to person, place, and time. He has normal reflexes. No cranial nerve deficit. He exhibits normal muscle tone. Coordination normal.  Skin: Skin is warm and dry. No rash noted. He is not diaphoretic. No erythema. No pallor.  Psychiatric: He has a normal mood and affect. His behavior is normal. Judgment and thought content normal.          Assessment & Plan:  Well exam. We discussed diet and exercise. Get fasting labs today. I believe he has an impingement syndrome in the right shoulder. We will refer him to Orthopedics for this.  Alysia Penna, MD

## 2016-09-17 NOTE — Patient Instructions (Signed)
WE NOW OFFER   [facility] FAST TRACK!!!  SAME DAY Appointments for ACUTE CARE  Such as: Sprains, Injuries, cuts, abrasions, rashes, muscle pain, joint pain, back pain Colds, flu, sore throats, headache, allergies, cough, fever  Ear pain, sinus and eye infections Abdominal pain, nausea, vomiting, diarrhea, upset stomach Animal/insect bites  3 Easy Ways to Schedule: Walk-In Scheduling Call in scheduling Mychart Sign-up: [email or web address]         

## 2016-09-19 ENCOUNTER — Other Ambulatory Visit: Payer: Self-pay | Admitting: Family Medicine

## 2016-10-03 ENCOUNTER — Telehealth: Payer: Self-pay | Admitting: Family Medicine

## 2016-10-03 NOTE — Telephone Encounter (Signed)
Can you help with this? I have done all I can, gave him the tests we will order along with our diagnosis codes for each test.

## 2016-10-03 NOTE — Telephone Encounter (Signed)
I sent pt a my chart message with labs and codes.

## 2016-11-27 ENCOUNTER — Telehealth: Payer: Self-pay | Admitting: Pulmonary Disease

## 2016-11-27 DIAGNOSIS — J45909 Unspecified asthma, uncomplicated: Secondary | ICD-10-CM

## 2016-11-27 NOTE — Telephone Encounter (Signed)
Has not been seen in a year Needs OV OK to send Rx once appt made

## 2016-11-27 NOTE — Telephone Encounter (Signed)
Pt's nebulizer has broken, request that we place an order for a new nebulizer.  Requesting that we order the machine from [facility]- requesting machine with SKU#: [id number]. Phone: [phone] FAX: [phone]   RA ok to order?  Thanks!

## 2016-11-27 NOTE — Telephone Encounter (Signed)
Pt states that he is coming in in 02-16-2023 and no sooner.  There is a recall in the system for an October OV.  Chesley Mires, MD   Note 03/06/16 3:03 PM He has been stable on CPAP.  He can f/u with Dr. Elsworth Soho one year after last visit with Eric Form.       In the middle of me explaining that we will send the order and see him in 16-Feb-2023 our phone lines went dead and we are unable to call patient back. Order placed for nebulizer. Will hold in triage to call patient 8/31 and apologize.

## 2016-11-28 NOTE — Telephone Encounter (Signed)
Spoke with patient. Apologized for the phone issues yesterday. He has been scheduled with RA for Sept 20th at 10am. Will place an appointment reminder in the mail for him. Nothing else needed at time of call.

## 2016-12-08 ENCOUNTER — Telehealth: Payer: Self-pay | Admitting: Pulmonary Disease

## 2016-12-08 NOTE — Telephone Encounter (Signed)
I cannot give him letter since I have not seen him yet SG can  Returning the machine is really his call & payment issues are for him to discuss with [facility]

## 2016-12-08 NOTE — Telephone Encounter (Signed)
Spoke with patient. He stated that he has been having a hard time getting supplies from [facility]. Because of this, he switched to [facility] in [place] (I don't see in his chart that we sent the order). [facility] is now charging him $100+ a month for rent on his CPAP machine. He can not afford this. He wanted to know if RA would be willing to send in a letter stating that he can not afford this so that [facility] can allow him to return the machine.   RA, please advise. Thanks!

## 2016-12-09 NOTE — Telephone Encounter (Signed)
Pt returned call and asked to please call his cell number [phone]. tr

## 2016-12-09 NOTE — Telephone Encounter (Signed)
Spoke with pt. He is aware of RA's response. Nothing further was needed. 

## 2016-12-09 NOTE — Telephone Encounter (Signed)
Left message to return call 

## 2016-12-18 ENCOUNTER — Ambulatory Visit: Payer: BLUE CROSS/BLUE SHIELD | Admitting: Pulmonary Disease

## 2016-12-18 ENCOUNTER — Encounter: Payer: Self-pay | Admitting: Family Medicine

## 2017-07-14 ENCOUNTER — Ambulatory Visit: Payer: BLUE CROSS/BLUE SHIELD | Admitting: Family Medicine

## 2017-07-14 ENCOUNTER — Encounter: Payer: Self-pay | Admitting: Family Medicine

## 2017-07-14 VITALS — BP 120/70 | HR 59 | Temp 97.8°F | Ht 66.0 in | Wt 243.4 lb

## 2017-07-14 DIAGNOSIS — J310 Chronic rhinitis: Secondary | ICD-10-CM

## 2017-07-14 MED ORDER — FLUTICASONE PROPIONATE 50 MCG/ACT NA SUSP: 2.0000 | Freq: Every day | NASAL | 6 refills | Status: DC

## 2017-07-14 MED ORDER — VERAPAMIL HCL ER 240 MG PO TBCR: 240.0000 mg | EXTENDED_RELEASE_TABLET | Freq: Every day | ORAL | 3 refills | Status: DC

## 2017-07-14 MED ORDER — LORATADINE-PSEUDOEPHEDRINE ER 10-240 MG PO TB24: 1.0000 | ORAL_TABLET | Freq: Every day | ORAL | 0 refills | Status: DC

## 2017-07-14 NOTE — Progress Notes (Signed)
   Subjective:    Patient ID: Alejandro Hill, male    DOB: May 27, 1955, 62 y.o.   MRN: [id number]  HPI Here for 8 months of nasal congestion that starts during the night while he is sleeping. He wears a CPAP mask and he has tried this both with and without humidification. He started taking Claritin D once a day and this has helped a little.    Review of Systems  Constitutional: Negative.   HENT: Positive for congestion and postnasal drip. Negative for sinus pressure, sinus pain and sore throat.   Eyes: Negative.   Respiratory: Negative.        Objective:   Physical Exam  Constitutional: He appears well-developed and well-nourished.  HENT:  Right Ear: External ear normal.  Left Ear: External ear normal.  Nose: Nose normal.  Mouth/Throat: Oropharynx is clear and moist.  Eyes: Conjunctivae are normal.  Neck: No thyromegaly present.  Pulmonary/Chest: Breath sounds normal. No respiratory distress. He has no wheezes. He has no rales.  Lymphadenopathy:    He has no cervical adenopathy.          Assessment & Plan:  Rhinitis, stay on Claritin D daily. Add Flonase daily.  Alysia Penna, MD

## 2017-07-28 ENCOUNTER — Telehealth: Payer: Self-pay | Admitting: Family Medicine

## 2017-07-28 NOTE — Telephone Encounter (Signed)
Copied from [place] [phone]. Topic: Quick Communication - Rx Refill/Question >> Jul 28, 2017  4:46 PM Marin Olp L wrote: Medication: allegra D extended release (will need prior auth per patient) Has the patient contacted their pharmacy? Yes.   (Agent: If no, request that the patient contact the pharmacy for the refill.) Preferred Pharmacy (with phone number or street name): [id number] - [place], [address] [place] [address] Phone: [phone] Fax: [phone] Agent: Please be advised that RX refills may take up to 3 business days. We ask that you follow-up with your pharmacy.

## 2017-07-29 NOTE — Telephone Encounter (Signed)
Sent to PCP for the OK to refill both or just the claritin D for now?   Sent to PCP for approval

## 2017-07-29 NOTE — Telephone Encounter (Signed)
TC to patient. Left VM to return call to nurse as he is requesting Allegra D. His latest visit with Dr. Sarajane Jews reads he is taking Claritin D.

## 2017-07-29 NOTE — Telephone Encounter (Signed)
Patient said both medications work.  He just needs a prescription called in.  Actually Claritin D will work for now because he has a coupon, but in the future he will need an Allegra D prescription because it will be cheaper through his insurance.

## 2017-07-30 MED ORDER — LORATADINE-PSEUDOEPHEDRINE ER 10-240 MG PO TB24: 1.0000 | ORAL_TABLET | Freq: Every day | ORAL | 3 refills | Status: DC

## 2017-07-30 NOTE — Telephone Encounter (Signed)
Refill the Claritin D for #90 with 3 rf

## 2017-08-03 MED ORDER — LORATADINE-PSEUDOEPHEDRINE ER 10-240 MG PO TB24: 1.0000 | ORAL_TABLET | Freq: Every day | ORAL | 3 refills | Status: DC

## 2017-08-03 NOTE — Telephone Encounter (Signed)
[facility] to [facility]

## 2017-08-03 NOTE — Telephone Encounter (Signed)
Pt is wanting to see if the loratadine-pseudoephedrine (CLARITIN-D 24 HOUR) 10-240 MG 24 hr tablet Can be resent but to  [id number] - [place], [place] - [address] [phone] (Phone) [phone] (Fax)

## 2017-08-03 NOTE — Addendum Note (Signed)
Addended by: Dimple Nanas on: 08/03/2017 11:35 AM   Modules accepted: Orders

## 2017-09-07 ENCOUNTER — Ambulatory Visit: Payer: Self-pay

## 2017-09-07 ENCOUNTER — Telehealth: Payer: Self-pay | Admitting: Family Medicine

## 2017-09-07 NOTE — Telephone Encounter (Signed)
No OV is needed. Call in a Medrol dose pack.

## 2017-09-07 NOTE — Telephone Encounter (Signed)
Patient called in with c/o "poison ivy rash." He says "I usually get poison ivy and the only thing that gets rid of it is Prednisone and Dr. Sarajane Jews will order it for me in case I need it. I forgot to ask him for a prescription the last time I was in the office. I am due to come in towards the end of the month and if I don't have to, I don't want to come all the way to [facility]." I asked where is there rash and what does it look like, he says "I got it about 1-1.5 weeks ago and it is red and blisters are on my arms both inside and outside about 1/5th of my arms, around my waist. The redness is to the lower part of my neck and my legs." I asked how severe is the itching, he says "I've been using an OTC poison ivy/sumac cream, so that's been helping with the itching." According to protocol, see PCP within 24 hours, patient refused an office visit. I advised the patient I will send this over to Dr. Sarajane Jews and if he needs to be seen in the office, someone will give him a call back, he verbalized understanding.    Reason for Disposition . Severe poison ivy, oak, or sumac reaction in the past  Answer Assessment - Initial Assessment Questions 1. APPEARANCE of RASH: "Describe the rash."      Blisters, open on arms and around waist, red to other areas, lower part of neck 2. LOCATION: "Where is the rash located?"      Arms, waist, legs 3. SIZE: "How large is the rash?"      1/5th of each arm on the inside and outside 4. ONSET: "When did the rash begin?"      1.5 weeks ago 5. ITCHING: "Does the rash itch?" If so, ask: "How bad is it?"   - MILD - doesn't interfere with normal activities   - MODERATE-SEVERE: interferes with work, school, sleep, or other activities      Mild-using OTC for poison ivy/sumac 6. PREGNANCY: "Is there any chance you are pregnant?" "When was your last menstrual period?"     N/A  Protocols used: POISON IVY - OAK - SUMAC-A-AH

## 2017-09-07 NOTE — Telephone Encounter (Signed)
Patient called and he says the Claritin D that was sent in is not working and would like to switch to [employer] D.  Last OV:07/24/17 PCP:[id number] [place], [place] [phone] (Phone) [phone] (Fax)

## 2017-09-07 NOTE — Telephone Encounter (Signed)
Pt also would like a RX for [employer] D

## 2017-09-07 NOTE — Telephone Encounter (Signed)
Copied from [place] [id number]. Topic: Quick Communication - Rx Refill/Question >> Sep 07, 2017  1:48 PM Celedonio Savage L wrote: Medication: prednisone    pt had gotten into poison ivy week in a half ago  Has the patient contacted their pharmacy? Yes.  Pt called pharmacy today they faxed over a request  (Agent: If no, request that the patient contact the pharmacy for the refill.) (Agent: If yes, when and what did the pharmacy advise?)  Preferred Pharmacy (with phone number or street name): [id number] [place], [place] [phone] (Phone) [phone] (Fax)      Agent: Please be advised that RX refills may take up to 3 business days. We ask that you follow-up with your pharmacy.

## 2017-09-07 NOTE — Telephone Encounter (Signed)
[facility] Triage Nurse states she was sending in Alejandro Hill for pt. Nothing further needed.

## 2017-09-08 MED ORDER — METHYLPREDNISOLONE 4 MG PO TBPK: ORAL_TABLET | ORAL | 0 refills | Status: DC

## 2017-09-08 MED ORDER — FEXOFENADINE-PSEUDOEPHED ER 180-240 MG PO TB24: 1.0000 | ORAL_TABLET | Freq: Every day | ORAL | 3 refills | Status: DC

## 2017-09-08 NOTE — Addendum Note (Signed)
Addended by: Myriam Forehand on: 09/08/2017 11:31 AM   Modules accepted: Orders

## 2017-09-08 NOTE — Addendum Note (Signed)
Addended by: Myriam Forehand on: 09/08/2017 09:28 AM   Modules accepted: Orders

## 2017-09-08 NOTE — Telephone Encounter (Signed)
[facility] [place] he wanted to know if we could send in a perception for allegra D rather than the Claritin D pt stated that the allegra D works better for him.  Sent to PCP to advise.

## 2017-09-08 NOTE — Telephone Encounter (Signed)
Also call in Allegra D 24 hour to take daily, #90 with 3 rf

## 2017-09-08 NOTE — Addendum Note (Signed)
Addended by: Myriam Forehand on: 09/08/2017 09:32 AM   Modules accepted: Orders

## 2017-09-08 NOTE — Telephone Encounter (Signed)
Medication was sent into pt's pharmacy

## 2017-09-10 MED ORDER — FEXOFENADINE-PSEUDOEPHED ER 180-240 MG PO TB24: 1.0000 | ORAL_TABLET | Freq: Every day | ORAL | 3 refills | Status: DC

## 2017-09-10 NOTE — Telephone Encounter (Signed)
[facility] and gave a verbal for Allegra D, confirmed that they will notify patient when ready for pick up.

## 2017-09-10 NOTE — Telephone Encounter (Addendum)
[facility] [place] [facility] for the Allegra D ordered by Dr. Sarajane Jews.  Our records show we called it in twice. The patient has been trying to get this prescription for several days now.  [facility], and she said nothing has come through to them. Please process. Patient would also like to know if it's not to much inconvenience, if you can call and let him know when the prescription has definitely reached the pharmacist. He can be reached at [phone].

## 2017-09-10 NOTE — Telephone Encounter (Signed)
Patient stated the pharmacy said they did not get the prescription for the Alegra D.  Please resend.

## 2017-09-23 ENCOUNTER — Telehealth: Payer: Self-pay | Admitting: Family Medicine

## 2017-09-23 ENCOUNTER — Encounter: Payer: Self-pay | Admitting: Family Medicine

## 2017-09-23 ENCOUNTER — Ambulatory Visit (INDEPENDENT_AMBULATORY_CARE_PROVIDER_SITE_OTHER): Payer: BLUE CROSS/BLUE SHIELD | Admitting: Family Medicine

## 2017-09-23 VITALS — BP 120/66 | HR 72 | Temp 97.9°F | Ht 64.75 in | Wt 232.4 lb

## 2017-09-23 DIAGNOSIS — Z0000 Encounter for general adult medical examination without abnormal findings: Secondary | ICD-10-CM | POA: Diagnosis not present

## 2017-09-23 NOTE — Telephone Encounter (Signed)
Replied to pt via MyChart with the following information:  Mr. Hauschild,  Our phone system is down today, therefore I am sending a reply to your question via MyChart.   The CPT Codes for the tests you asked about are as follows:   BMP 80048  CBC 85025  PSA  [phone]   I have forwarded your request for the Prior Authorization for the Allegra-D to our staff member handling those requests.   Thank you!  Brinton Brandel  [place]

## 2017-09-23 NOTE — Telephone Encounter (Unsigned)
Copied from [place] [id number]. Topic: Quick Communication - See Telephone Encounter >> Sep 23, 2017  1:52 PM Percell Belt A wrote: CRM for notification. See Telephone encounter for: 09/23/17.  Pt called in and is stating they he needs the codes for preventive lab work for the following:  Metabolic Panel  PSA  CBC   Best number -[phone] Pt is stating ins is telling him that he needs a PA done on the fexofenadine-pseudoephedrine (ALLEGRA-D ALLERGY & CONGESTION) 180-240 MG 24 hr tablet

## 2017-09-23 NOTE — Progress Notes (Signed)
   Subjective:    Patient ID: Alejandro Hill, male    DOB: [birth year], 62 y.o.   MRN: [id number]  HPI Here for a well exam. He is doing well. His BP is stable.    Review of Systems  Constitutional: Negative.   HENT: Negative.   Eyes: Negative.   Respiratory: Negative.   Cardiovascular: Negative.   Gastrointestinal: Negative.   Genitourinary: Negative.   Musculoskeletal: Negative.   Skin: Negative.   Neurological: Negative.   Psychiatric/Behavioral: Negative.        Objective:   Physical Exam  Constitutional: He is oriented to person, place, and time. He appears well-developed and well-nourished. No distress.  HENT:  Head: Normocephalic and atraumatic.  Right Ear: External ear normal.  Left Ear: External ear normal.  Nose: Nose normal.  Mouth/Throat: Oropharynx is clear and moist. No oropharyngeal exudate.  Eyes: Pupils are equal, round, and reactive to light. Conjunctivae and EOM are normal. Right eye exhibits no discharge. Left eye exhibits no discharge. No scleral icterus.  Neck: Neck supple. No JVD present. No tracheal deviation present. No thyromegaly present.  Cardiovascular: Normal rate, regular rhythm, normal heart sounds and intact distal pulses. Exam reveals no gallop and no friction rub.  No murmur heard. Pulmonary/Chest: Effort normal and breath sounds normal. No respiratory distress. He has no wheezes. He has no rales. He exhibits no tenderness.  Abdominal: Soft. Bowel sounds are normal. He exhibits no distension and no mass. There is no tenderness. There is no rebound and no guarding.  Genitourinary: Rectum normal, prostate normal and penis normal. Rectal exam shows guaiac negative stool. No penile tenderness.  Musculoskeletal: Normal range of motion. He exhibits no edema or tenderness.  Lymphadenopathy:    He has no cervical adenopathy.  Neurological: He is alert and oriented to person, place, and time. He has normal reflexes. He displays normal reflexes. No  cranial nerve deficit. He exhibits normal muscle tone. Coordination normal.  Skin: Skin is warm and dry. No rash noted. He is not diaphoretic. No erythema. No pallor.  Psychiatric: He has a normal mood and affect. His behavior is normal. Judgment and thought content normal.          Assessment & Plan:  Well exam. We discussed diet and exercise. He will check with his insurance company about what is covered before we order lab work.  Alysia Penna, MD

## 2017-09-29 NOTE — Telephone Encounter (Signed)
Prior auth for Fexophenadine-pseudoephedrine 180mg  sent to [id number].

## 2017-10-05 NOTE — Telephone Encounter (Signed)
Do you have the paper work for this?

## 2017-10-05 NOTE — Telephone Encounter (Signed)
Fax received from [facility] stating the request was denied and this was given to Dr Barbie Banner asst.

## 2017-10-06 NOTE — Telephone Encounter (Signed)
Form placed in Dr. Damien Fusi  folder to review for another option for the pt.

## 2017-10-06 NOTE — Telephone Encounter (Signed)
His only option is to buy this OTC. All allergy meds like this are now OTC and his insurance will not cover it

## 2017-10-07 NOTE — Telephone Encounter (Signed)
Called and spoke with pt. Pt advised and voiced understanding.  

## 2017-10-09 ENCOUNTER — Telehealth: Payer: Self-pay | Admitting: *Deleted

## 2017-10-09 MED ORDER — MONTELUKAST SODIUM 10 MG PO TABS: 10.0000 mg | ORAL_TABLET | Freq: Every day | ORAL | 3 refills | Status: DC

## 2017-10-09 NOTE — Telephone Encounter (Signed)
Rx has been sent  

## 2017-10-09 NOTE — Telephone Encounter (Signed)
We did send in a 90 day supply on 09/10/2017 with 3 refills   Will need to call pt.   Called and spoke with pt he stated that he would actually like to just switch from allergra-D because the sudafed in the allegra -D didn't work well for him. Pt stated that he used to take montelukast in the past and he doesn't recall any issues with this medication and would like to try this plus it will only cost him 4 dollars with his insurance.  Sent to PCP for approval to send in montelukast

## 2017-10-09 NOTE — Telephone Encounter (Signed)
Sent to PCP for the approval to send in a 90 day supply for allegra-D

## 2017-10-09 NOTE — Addendum Note (Signed)
Addended by: Myriam Forehand on: 10/09/2017 05:13 PM   Modules accepted: Orders

## 2017-10-09 NOTE — Telephone Encounter (Signed)
Copied from [place] [id number]. Topic: General - Other >> Oct 09, 2017 12:06 PM Yvette Rack wrote: Reason for CRM: patient calling stating that the insurance want approve the fexofenadine-pseudoephedrine (ALLEGRA-D ALLERGY & CONGESTION) 180-240 MG 24 hr tablet  but they will approve the Allegra 90 day supply but if Dr Sarajane Jews want him to do something else he would like to have the [facility] 10mg  because its cheap  with a 90 day supply he would [id number] [place], [place] - [place] [phone] (Phone) [phone] (Fax)

## 2017-10-09 NOTE — Telephone Encounter (Signed)
Call in Montelukast 10 mg to take daily, #90 with 3 rf

## 2017-10-16 ENCOUNTER — Other Ambulatory Visit: Payer: Self-pay

## 2017-10-16 MED ORDER — MONTELUKAST SODIUM 10 MG PO TABS: 10.0000 mg | ORAL_TABLET | Freq: Every day | ORAL | 3 refills | Status: DC

## 2018-01-04 ENCOUNTER — Other Ambulatory Visit: Payer: Self-pay | Admitting: Family Medicine

## 2018-02-23 ENCOUNTER — Ambulatory Visit: Payer: BLUE CROSS/BLUE SHIELD | Admitting: Family Medicine

## 2018-02-23 ENCOUNTER — Encounter: Payer: Self-pay | Admitting: Family Medicine

## 2018-02-23 ENCOUNTER — Ambulatory Visit (INDEPENDENT_AMBULATORY_CARE_PROVIDER_SITE_OTHER): Payer: BLUE CROSS/BLUE SHIELD

## 2018-02-23 VITALS — BP 142/80 | HR 50 | Temp 98.1°F | Wt 222.1 lb

## 2018-02-23 DIAGNOSIS — M542 Cervicalgia: Secondary | ICD-10-CM

## 2018-02-23 MED ORDER — CYCLOBENZAPRINE HCL 10 MG PO TABS: 10.0000 mg | ORAL_TABLET | Freq: Three times a day (TID) | ORAL | 2 refills | Status: DC | PRN

## 2018-02-23 MED ORDER — IBUPROFEN 800 MG PO TABS: 800.0000 mg | ORAL_TABLET | Freq: Four times a day (QID) | ORAL | 2 refills | Status: DC | PRN

## 2018-02-23 NOTE — Progress Notes (Signed)
   Subjective:    Patient ID: BILAAL LEIB, male    DOB: October 24, 1955, 62 y.o.   MRN: [id number]  HPI Here for several weeks of tightness and pain in the posterior neck. This pain radiates up the left side of the back of his head and causes headaches. No recent trauma. Heat and massage helps.   Review of Systems  Constitutional: Negative.   Respiratory: Negative.   Cardiovascular: Negative.   Musculoskeletal: Positive for neck pain and neck stiffness.  Neurological: Positive for headaches.       Objective:   Physical Exam  Constitutional: He is oriented to person, place, and time. He appears well-developed and well-nourished.  Cardiovascular: Normal rate, regular rhythm, normal heart sounds and intact distal pulses.  Pulmonary/Chest: Effort normal and breath sounds normal.  Musculoskeletal:  He is tender in the posterior neck, especially over the C4 and C5 areas. He has tenderness and spasms in the upper trapezei. ROM is reduced   Neurological: He is alert and oriented to person, place, and time.          Assessment & Plan:  Neck pain. He will try Ibuprofen 800 mg and Flexeril prn. Get C spine Xrays today. Alysia Penna, MD

## 2018-02-23 NOTE — Addendum Note (Signed)
Addended by: Gwynne Edinger on: 02/23/2018 09:52 AM   Modules accepted: Orders

## 2018-03-01 ENCOUNTER — Telehealth: Payer: Self-pay | Admitting: Family Medicine

## 2018-03-01 NOTE — Telephone Encounter (Signed)
Copied from [place] [id number]. Topic: Quick Communication - See Telephone Encounter >> Mar 01, 2018 11:16 AM Nils Flack wrote: CRM for notification. See Telephone encounter for: 03/01/18. Pt received message that information was available on my chart, but pt can not get into his my chart.  He is asking for call, he thinks it is about xray Please call cell [phone]

## 2018-03-01 NOTE — Telephone Encounter (Signed)
Shows degenerative discs as expected but nothing of a surgical nature     Per Dr. Sarajane Jews.    Called and spoke with pt and he is aware of results per Dr. Sarajane Jews.

## 2018-04-02 ENCOUNTER — Other Ambulatory Visit: Payer: Self-pay | Admitting: Family Medicine

## 2018-04-02 MED ORDER — FLUTICASONE PROPIONATE 50 MCG/ACT NA SUSP: NASAL | 2 refills | Status: DC

## 2018-04-02 NOTE — Telephone Encounter (Signed)
Copied from [place] [phone]. Topic: Quick Communication - Rx Refill/Question >> Apr 02, 2018 11:17 AM Scherrie Gerlach wrote: Medication: fluticasone (FLONASE) 50 MCG/ACT nasal spray  Has the patient contacted their pharmacy? [facility] but it was denied.  I do not see we received this. Pt requesting. [id number] [place], [place] [phone] (Phone) [phone] (Fax)

## 2018-04-21 ENCOUNTER — Other Ambulatory Visit: Payer: Self-pay | Admitting: Family Medicine

## 2018-05-19 ENCOUNTER — Other Ambulatory Visit: Payer: Self-pay | Admitting: Family Medicine

## 2018-05-19 NOTE — Telephone Encounter (Signed)
Last rx given on 11/26 for #90 with 2 ref

## 2018-06-29 ENCOUNTER — Other Ambulatory Visit: Payer: Self-pay | Admitting: Family Medicine

## 2018-06-30 ENCOUNTER — Telehealth: Payer: Self-pay | Admitting: Family Medicine

## 2018-06-30 MED ORDER — FEXOFENADINE-PSEUDOEPHED ER 180-240 MG PO TB24: 1.0000 | ORAL_TABLET | Freq: Every day | ORAL | 3 refills | Status: DC

## 2018-06-30 MED ORDER — MONTELUKAST SODIUM 10 MG PO TABS: 10.0000 mg | ORAL_TABLET | Freq: Every day | ORAL | 3 refills | Status: DC

## 2018-06-30 MED ORDER — FLUTICASONE PROPIONATE 50 MCG/ACT NA SUSP: NASAL | 3 refills | Status: DC

## 2018-06-30 MED ORDER — VERAPAMIL HCL ER 240 MG PO TBCR: 240.0000 mg | EXTENDED_RELEASE_TABLET | Freq: Every day | ORAL | 3 refills | Status: DC

## 2018-06-30 MED ORDER — IBUPROFEN 800 MG PO TABS: 800.0000 mg | ORAL_TABLET | Freq: Four times a day (QID) | ORAL | 3 refills | Status: DC | PRN

## 2018-06-30 MED ORDER — CYCLOBENZAPRINE HCL 10 MG PO TABS: ORAL_TABLET | ORAL | 3 refills | Status: DC

## 2018-06-30 NOTE — Telephone Encounter (Signed)
Please refill all these meds for 90 days with 3 rf

## 2018-06-30 NOTE — Telephone Encounter (Signed)
Copied from [place] [id number]. Topic: Quick Communication - Rx Refill/Question >> Jun 30, 2018  8:31 AM Virl Axe D wrote: Medication: fexofenadine-pseudoephedrine (ALLEGRA-D ALLERGY & CONGESTION) 180-240 MG 24 hr tablet / Pt requesting 6 month supply. [employer] approved.  Has the patient contacted their pharmacy? Yes.   (Agent: If no, request that the patient contact the pharmacy for the refill.) (Agent: If yes, when and what did the pharmacy advise?)  Preferred Pharmacy (with phone number or street name): [facility] [place], [place] - [address] [phone] (Phone) [phone] (Fax)    Agent: Please be advised that RX refills may take up to 3 business days. We ask that you follow-up with your pharmacy.

## 2018-06-30 NOTE — Telephone Encounter (Signed)
Refills sent to the pharmacy per pts request.

## 2018-06-30 NOTE — Telephone Encounter (Signed)
Dr. Sarajane Jews please advise on refills.  Pt was last seen 02/23/2018 for neck pain and 09/23/2017 for preventative health visit.

## 2018-06-30 NOTE — Telephone Encounter (Signed)
Refill has been sent to the pharmacy.  

## 2018-06-30 NOTE — Telephone Encounter (Signed)
Copied from [place] [id number]. Topic: Quick Communication - Rx Refill/Question >> Jun 30, 2018  8:25 AM Virl Axe D wrote: Medication: cyclobenzaprine (FLEXERIL) 10 MG tablet / ibuprofen (ADVIL,MOTRIN) 800 MG tablet / montelukast (SINGULAIR) 10 MG tablet / verapamil (CALAN-SR) 240 MG CR tablet /fluticasone (FLONASE) 50 MCG/ACT nasal spray/ Pt asking for 6 month supply of prescriptions. Okayed by [facility]  Has the patient contacted their pharmacy? Yes.   (Agent: If no, request that the patient contact the pharmacy for the refill.) (Agent: If yes, when and what did the pharmacy advise?)  Preferred Pharmacy (with phone number or street name): [id number] [place], [place] [phone] (Phone) [phone] (Fax)  Agent: Please be advised that RX refills may take up to 3 business days. We ask that you follow-up with your pharmacy.

## 2018-07-07 ENCOUNTER — Other Ambulatory Visit: Payer: Self-pay | Admitting: Family Medicine

## 2018-07-13 MED ORDER — FEXOFENADINE-PSEUDOEPHED ER 180-240 MG PO TB24: 1.0000 | ORAL_TABLET | Freq: Every day | ORAL | 3 refills | Status: DC

## 2018-07-13 NOTE — Addendum Note (Signed)
Addended by: Virl Cagey on: 07/13/2018 04:01 PM   Modules accepted: Orders

## 2018-07-13 NOTE — Telephone Encounter (Signed)
Alejandro Hill with [facility] stating that they never received the Rx for Allegra -D that was sent in on 06/30/18  This has been resent to pharmacy.

## 2018-07-28 ENCOUNTER — Other Ambulatory Visit: Payer: Self-pay | Admitting: Family Medicine

## 2018-07-28 MED ORDER — FLUTICASONE PROPIONATE 50 MCG/ACT NA SUSP: NASAL | 3 refills | Status: DC

## 2018-08-10 ENCOUNTER — Telehealth: Payer: Self-pay | Admitting: Family Medicine

## 2018-08-10 MED ORDER — CYCLOBENZAPRINE HCL 10 MG PO TABS: ORAL_TABLET | ORAL | 3 refills | Status: DC

## 2018-08-10 MED ORDER — VERAPAMIL HCL ER 240 MG PO TBCR: 240.0000 mg | EXTENDED_RELEASE_TABLET | Freq: Every day | ORAL | 3 refills | Status: DC

## 2018-08-10 MED ORDER — MONTELUKAST SODIUM 10 MG PO TABS: 10.0000 mg | ORAL_TABLET | Freq: Every day | ORAL | 3 refills | Status: DC

## 2018-08-10 NOTE — Telephone Encounter (Signed)
Refills sent to the [facility] per pts request.

## 2018-08-10 NOTE — Telephone Encounter (Signed)
See refill request below

## 2018-08-10 NOTE — Addendum Note (Signed)
Addended by: Elie Confer on: 08/10/2018 10:04 AM   Modules accepted: Orders

## 2018-08-10 NOTE — Telephone Encounter (Signed)
Copied from [place] [phone]. Topic: Quick Communication - Rx Refill/Question >> Aug 10, 2018  9:22 AM Marin Olp L wrote: Medication: verapamil (CALAN-SR) 240 MG CR tablet, montelukast (SINGULAIR) 10 MG tablet, cyclobenzaprine (FLEXERIL) 10 MG tablet (patient changed insurance and pharmacies)  Has the patient contacted their pharmacy? yes  (Agent: If no, request that the patient contact the pharmacy for the refill.) (Agent: If yes, when and what did the pharmacy advise?)  Preferred Pharmacy (with phone number or street name): [place], [place] [address] Phone: [phone] Fax: [phone]  Agent: Please be advised that RX refills may take up to 3 business days. We ask that you follow-up with your pharmacy.

## 2018-08-25 ENCOUNTER — Other Ambulatory Visit: Payer: Self-pay

## 2018-08-25 ENCOUNTER — Ambulatory Visit (INDEPENDENT_AMBULATORY_CARE_PROVIDER_SITE_OTHER): Payer: Medicare HMO | Admitting: Family Medicine

## 2018-08-25 ENCOUNTER — Encounter: Payer: Self-pay | Admitting: Family Medicine

## 2018-08-25 DIAGNOSIS — F411 Generalized anxiety disorder: Secondary | ICD-10-CM | POA: Diagnosis not present

## 2018-08-25 DIAGNOSIS — F419 Anxiety disorder, unspecified: Secondary | ICD-10-CM | POA: Insufficient documentation

## 2018-08-25 MED ORDER — ESCITALOPRAM OXALATE 10 MG PO TABS: 10.0000 mg | ORAL_TABLET | Freq: Every day | ORAL | 3 refills | Status: DC

## 2018-08-25 NOTE — Progress Notes (Signed)
Subjective:    Patient ID: Alejandro Hill, male    DOB: [birth year], 63 y.o.   MRN: [id number]  HPI Virtual Visit via Video Note  I connected with the patient on 08/25/18 at  8:00 AM EDT by a video enabled telemedicine application and verified that I am speaking with the correct person using two identifiers.  Location patient: home Location provider:work or home office Persons participating in the virtual visit: patient, provider  I discussed the limitations of evaluation and management by telemedicine and the availability of in person appointments. The patient expressed understanding and agreed to proceed.   HPI: Here to ask for help with anxiety. He and his wife have been dealing with a lot of stress, since their daughter and 2 young grandchildren moved in with them 3 years ago. He tried Prozac for awhile in 2017 and had some mixed results, then he stopped it. Now he again feels worried and stressed, and his wife thinks he should be on a medication again. He has some trouble sleeping but his appetite is good. His glucoses have been stable in the range of 90 to 110. His BP has averaged 120/70.    ROS: See pertinent positives and negatives per HPI.  Past Medical History:  Diagnosis Date   Anemia    as a child   Asthma    exertional, seasonal (spring)   Diabetes mellitus    type II diabetes. Sees Dr Chalmers Cater   Heart murmur    as a child   Hemorrhoids    Hyperlipidemia    Hypertension    Hypogonadism male    sees Dr. Diona Fanti   Kidney stone    Migraine    cluster, had seen [facility]   Pneumonia 2012   Seasonal allergies    Sleep apnea    , uses cpap   Thyroid disease    hypothyroidism - not taking medications except for [place]   Umbilical hernia     Past Surgical History:  Procedure Laterality Date   CATARACT EXTRACTION Bilateral 01/2011   with lens implant   COLONOSCOPY  02-19-09   per Dr. Deatra Ina, repeat in 10 yrs    EYE SURGERY  Bilateral    RK in his 67's, lasik when he was [place] 02/20/2014   Procedure: HEMORRHOIDECTOMY;  Surgeon: Autumn Messing III, MD;  Location: [facility];  Service: General;  Laterality: N/A;   HERNIA REPAIR  08/03/07   left inguinal herniorrhaphy. Dr Marlou Starks   INSERTION OF MESH N/A 02/20/2014   Procedure: INSERTION OF MESH;  Surgeon: Autumn Messing III, MD;  Location: [facility];  Service: General;  Laterality: N/A;   TONSILLECTOMY     UMBILICAL HERNIA REPAIR  02/20/2014   & EXTERNAL HEMORRHOROIDS     DR TOTH   UMBILICAL HERNIA REPAIR N/A 02/20/2014   Procedure: HERNIA REPAIR UMBILICAL ADULT;  Surgeon: Autumn Messing III, MD;  Location: [facility];  Service: General;  Laterality: N/A;   UVULOPALATOPHARYNGOPLASTY     per Dr Ernesto Rutherford   VASECTOMY     WISDOM TOOTH EXTRACTION      Family History  Problem Relation Age of Onset   Heart disease Mother    Liver disease Father    Lung cancer Father    Heart disease Other    Hyperlipidemia Other    Stroke Other      Current Outpatient Medications:    Cholecalciferol (VITAMIN D-3) 125 MCG (5000 UT) TABS, Take  1 tablet by mouth daily., Disp: , Rfl:    Cinnamon 500 MG TABS, Take 2 tablets by mouth daily. , Disp: , Rfl:    cyclobenzaprine (FLEXERIL) 10 MG tablet, TAKE 1 TABLET BY MOUTH THREE TIMES A DAY AS NEEDED FOR MUSCLE SPASMS, Disp: 90 tablet, Rfl: 3   escitalopram (LEXAPRO) 10 MG tablet, Take 1 tablet (10 mg total) by mouth daily., Disp: 90 tablet, Rfl: 3   fexofenadine-pseudoephedrine (ALLEGRA-D ALLERGY & CONGESTION) 180-240 MG 24 hr tablet, Take 1 tablet by mouth daily., Disp: 90 tablet, Rfl: 3   fluticasone (FLONASE) 50 MCG/ACT nasal spray, SPRAY 2 SPRAYS INTO EACH NOSTRIL EVERY DAY, Disp: 48 g, Rfl: 3   Garlic 937 MG CAPS, Take 2 capsules by mouth daily. , Disp: , Rfl:    ibuprofen (ADVIL,MOTRIN) 800 MG tablet, Take 1 tablet (800 mg total) by mouth every 6 (six) hours as needed for moderate pain., Disp: 90 tablet, Rfl: 3    KRILL OIL PO, Take by mouth daily., Disp: , Rfl:    LYSINE PO, Take 1 tablet by mouth daily. , Disp: , Rfl:    montelukast (SINGULAIR) 10 MG tablet, Take 1 tablet (10 mg total) by mouth at bedtime., Disp: 90 tablet, Rfl: 3   Multiple Vitamin (MULTIVITAMIN) tablet, Take 1 tablet by mouth daily., Disp: , Rfl:    NON FORMULARY, 5 mcg daily. [place], Disp: , Rfl:    Red Yeast Rice Extract (RED YEAST RICE PO), Take 1 capsule by mouth 2 (two) times daily. , Disp: , Rfl:    verapamil (CALAN-SR) 240 MG CR tablet, Take 1 tablet (240 mg total) by mouth daily., Disp: 90 tablet, Rfl: 3  EXAM:  VITALS per patient if applicable:  GENERAL: alert, oriented, appears well and in no acute distress  HEENT: atraumatic, conjunttiva clear, no obvious abnormalities on inspection of external nose and ears  NECK: normal movements of the head and neck  LUNGS: on inspection no signs of respiratory distress, breathing rate appears normal, no obvious gross SOB, gasping or wheezing  CV: no obvious cyanosis  MS: moves all visible extremities without noticeable abnormality  PSYCH/NEURO: pleasant and cooperative, no obvious depression or anxiety, speech and thought processing grossly intact  ASSESSMENT AND PLAN: Anxiety, he will try Lexapro 10 mg daily. Recheck prn.  Alysia Penna, MD  Discussed the following assessment and plan:  No diagnosis found.     I discussed the assessment and treatment plan with the patient. The patient was provided an opportunity to ask questions and all were answered. The patient agreed with the plan and demonstrated an understanding of the instructions.   The patient was advised to call back or seek an in-person evaluation if the symptoms worsen or if the condition fails to improve as anticipated.     Review of Systems     Objective:   Physical Exam        Assessment & Plan:

## 2018-08-30 ENCOUNTER — Telehealth: Payer: Self-pay | Admitting: Family Medicine

## 2018-08-30 NOTE — Telephone Encounter (Signed)
Copied from [place] [id number]. Topic: Quick Communication - Rx Refill/Question >> Aug 30, 2018  4:21 PM Alejandro Hill, [place] A wrote: Medication: cyclobenzaprine (FLEXERIL) 10 MG tablet ,ibuprofen (ADVIL,MOTRIN) 800 MG tablet (Pharmacy stated that they have not heard back on medications.)  Has the patient contacted their pharmacy? Yes (Agent: If no, request that the patient contact the pharmacy for the refill.) (Agent: If yes, when and what did the pharmacy advise?)Contact PCP  Preferred Pharmacy (with phone number or street name): [place], [place] [phone] (Phone) [phone] (Fax)    Agent: Please be advised that RX refills may take up to 3 business days. We ask that you follow-up with your pharmacy.

## 2018-08-31 MED ORDER — CYCLOBENZAPRINE HCL 10 MG PO TABS: ORAL_TABLET | ORAL | 3 refills | Status: DC

## 2018-08-31 MED ORDER — IBUPROFEN 800 MG PO TABS: 800.0000 mg | ORAL_TABLET | Freq: Four times a day (QID) | ORAL | 3 refills | Status: DC | PRN

## 2018-08-31 NOTE — Telephone Encounter (Signed)
I have sent both refills to [facility]

## 2018-08-31 NOTE — Telephone Encounter (Signed)
Dr. Sarajane Jews please advise on refills.  [employer] stated that they have not received the refills on these 2 meds.  Thanks

## 2018-10-19 ENCOUNTER — Ambulatory Visit (INDEPENDENT_AMBULATORY_CARE_PROVIDER_SITE_OTHER): Payer: Medicare HMO | Admitting: Family Medicine

## 2018-10-19 ENCOUNTER — Other Ambulatory Visit: Payer: Self-pay

## 2018-10-19 ENCOUNTER — Encounter: Payer: Self-pay | Admitting: Family Medicine

## 2018-10-19 VITALS — BP 136/62 | HR 72 | Temp 98.0°F | Wt 217.8 lb

## 2018-10-19 DIAGNOSIS — E119 Type 2 diabetes mellitus without complications: Secondary | ICD-10-CM | POA: Diagnosis not present

## 2018-10-19 DIAGNOSIS — Z0000 Encounter for general adult medical examination without abnormal findings: Secondary | ICD-10-CM | POA: Diagnosis not present

## 2018-10-19 DIAGNOSIS — R195 Other fecal abnormalities: Secondary | ICD-10-CM

## 2018-10-19 LAB — CBC WITH DIFFERENTIAL/PLATELET
Basophils Absolute: 0 10*3/uL (ref 0.0–0.1)
Basophils Relative: 0.6 % (ref 0.0–3.0)
Eosinophils Absolute: 0.1 10*3/uL (ref 0.0–0.7)
Eosinophils Relative: 1.5 % (ref 0.0–5.0)
HCT: 47.5 % (ref 39.0–52.0)
Hemoglobin: 16.4 g/dL (ref 13.0–17.0)
Lymphocytes Relative: 19.8 % (ref 12.0–46.0)
Lymphs Abs: 1 10*3/uL (ref 0.7–4.0)
MCHC: 34.6 g/dL (ref 30.0–36.0)
MCV: 88.4 fl (ref 78.0–100.0)
Monocytes Absolute: 0.4 10*3/uL (ref 0.1–1.0)
Monocytes Relative: 8.3 % (ref 3.0–12.0)
Neutro Abs: 3.6 10*3/uL (ref 1.4–7.7)
Neutrophils Relative %: 69.8 % (ref 43.0–77.0)
Platelets: 194 10*3/uL (ref 150.0–400.0)
RBC: 5.37 Mil/uL (ref 4.22–5.81)
RDW: 13.3 % (ref 11.5–15.5)
WBC: 5.1 10*3/uL (ref 4.0–10.5)

## 2018-10-19 LAB — LIPID PANEL
Cholesterol: 249 mg/dL — ABNORMAL HIGH (ref 0–200)
HDL: 53.7 mg/dL (ref >39.00)
LDL Cholesterol: 168 mg/dL — ABNORMAL HIGH (ref 0–99)
NonHDL: 195.59
Total CHOL/HDL Ratio: 5
Triglycerides: 140 mg/dL (ref 0.0–149.0)
VLDL: 28 mg/dL (ref 0.0–40.0)

## 2018-10-19 LAB — POC URINALSYSI DIPSTICK (AUTOMATED)
Bilirubin, UA: NEGATIVE
Blood, UA: NEGATIVE
Glucose, UA: NEGATIVE
Ketones, UA: NEGATIVE
Leukocytes, UA: NEGATIVE
Nitrite, UA: NEGATIVE
Protein, UA: POSITIVE — AB
Spec Grav, UA: 1.025 (ref 1.010–1.025)
Urobilinogen, UA: 0.2 E.U./dL
pH, UA: 6 (ref 5.0–8.0)

## 2018-10-19 LAB — BASIC METABOLIC PANEL
BUN: 25 mg/dL — ABNORMAL HIGH (ref 6–23)
CO2: 28 mEq/L (ref 19–32)
Calcium: 9.1 mg/dL (ref 8.4–10.5)
Chloride: 103 mEq/L (ref 96–112)
Creatinine, Ser: 0.94 mg/dL (ref 0.40–1.50)
GFR: 80.5 mL/min (ref >60.00)
Glucose, Bld: 127 mg/dL — ABNORMAL HIGH (ref 70–99)
Potassium: 4.3 mEq/L (ref 3.5–5.1)
Sodium: 139 mEq/L (ref 135–145)

## 2018-10-19 LAB — HEPATIC FUNCTION PANEL
ALT: 20 U/L (ref 0–53)
AST: 17 U/L (ref 0–37)
Albumin: 4.8 g/dL (ref 3.5–5.2)
Alkaline Phosphatase: 75 U/L (ref 39–117)
Bilirubin, Direct: 0.1 mg/dL (ref 0.0–0.3)
Total Bilirubin: 0.9 mg/dL (ref 0.2–1.2)
Total Protein: 7 g/dL (ref 6.0–8.3)

## 2018-10-19 LAB — HEMOGLOBIN A1C: Hgb A1c MFr Bld: 5.7 % (ref 4.6–6.5)

## 2018-10-19 LAB — PSA: PSA: 1.01 ng/mL (ref 0.10–4.00)

## 2018-10-19 LAB — TSH: TSH: 1.28 u[IU]/mL (ref 0.35–4.50)

## 2018-10-19 NOTE — Progress Notes (Signed)
   Subjective:    Patient ID: Alejandro Hill, male    DOB: 1956-03-17, 63 y.o.   MRN: [id number]  HPI Here for a well exam. He feels great and has no concerns. His am fasting glucoses average in the 90s.    Review of Systems  Constitutional: Negative.   HENT: Negative.   Eyes: Negative.   Respiratory: Negative.   Cardiovascular: Negative.   Gastrointestinal: Negative.   Genitourinary: Negative.   Musculoskeletal: Negative.   Skin: Negative.   Neurological: Negative.   Psychiatric/Behavioral: Negative.        Objective:   Physical Exam Constitutional:      General: He is not in acute distress.    Appearance: He is well-developed. He is not diaphoretic.  HENT:     Head: Normocephalic and atraumatic.     Right Ear: External ear normal.     Left Ear: External ear normal.     Nose: Nose normal.     Mouth/Throat:     Pharynx: No oropharyngeal exudate.  Eyes:     General: No scleral icterus.       Right eye: No discharge.        Left eye: No discharge.     Conjunctiva/sclera: Conjunctivae normal.     Pupils: Pupils are equal, round, and reactive to light.  Neck:     Musculoskeletal: Neck supple.     Thyroid: No thyromegaly.     Vascular: No JVD.     Trachea: No tracheal deviation.  Cardiovascular:     Rate and Rhythm: Normal rate and regular rhythm.     Heart sounds: Normal heart sounds. No murmur. No friction rub. No gallop.   Pulmonary:     Effort: Pulmonary effort is normal. No respiratory distress.     Breath sounds: Normal breath sounds. No wheezing or rales.  Chest:     Chest wall: No tenderness.  Abdominal:     General: Bowel sounds are normal. There is no distension.     Palpations: Abdomen is soft. There is no mass.     Tenderness: There is no abdominal tenderness. There is no guarding or rebound.  Genitourinary:    Penis: Normal. No tenderness.      Prostate: Normal.     Rectum: Normal. Guaiac result negative.  Musculoskeletal: Normal range of motion.         General: No tenderness.  Lymphadenopathy:     Cervical: No cervical adenopathy.  Skin:    General: Skin is warm and dry.     Coloration: Skin is not pale.     Findings: No erythema or rash.  Neurological:     Mental Status: He is alert and oriented to person, place, and time.     Cranial Nerves: No cranial nerve deficit.     Motor: No abnormal muscle tone.     Coordination: Coordination normal.     Deep Tendon Reflexes: Reflexes are normal and symmetric. Reflexes normal.  Psychiatric:        Behavior: Behavior normal.        Thought Content: Thought content normal.        Judgment: Judgment normal.           Assessment & Plan:  Well exam. We discussed diet and exercise. Get fasting labs.  Alysia Penna, MD

## 2018-12-10 ENCOUNTER — Telehealth: Payer: Self-pay | Admitting: Family Medicine

## 2018-12-10 DIAGNOSIS — Z0000 Encounter for general adult medical examination without abnormal findings: Secondary | ICD-10-CM

## 2018-12-10 NOTE — Telephone Encounter (Signed)
Patient is calling to let Dr. Sarajane Jews know that his insurance know that his insurance will not cover the coloscopy. It is $295  Patient woud like to know what other options does he have? Can he do the mail in Colonoscopy?  Please advise [id number]

## 2018-12-13 NOTE — Telephone Encounter (Signed)
Yes, I ordered the Cologuard test for him

## 2018-12-15 NOTE — Telephone Encounter (Signed)
This has been taking care °

## 2018-12-23 ENCOUNTER — Telehealth: Payer: Self-pay

## 2018-12-23 NOTE — Telephone Encounter (Signed)
Copied from [place] [id number]. Topic: General - Other >> Dec 23, 2018 11:48 AM Ivar Drape wrote: Reason for CRM:   Patient would like a call back to discuss his anxiety medication.  He said he is having side effects from the one that was prescribed for him.

## 2018-12-23 NOTE — Telephone Encounter (Signed)
Spoke to pt and he stated that he is having side effects to medication that the was recently prescribed (escitalipram) . Pt claim he was having frequent urination. Pt also stated that he gained weight from medication. Pt stated it starts off slow then it flows slowly. Pt stated that he stopped taking the medication and now he is doing better. PT stated that he did not want to start the medication but was advised by his wife to start. Pt stated that he may be stopping the medication. Pt also stated that his granddaughter is going through a lot and is on anxiety medications and she was having side effects. Pt stated that his granddaughter was given an alternative medication for anxiety and it feels better. Granddaughter is taking paroxetine HCL 10 mg once daily.     Please advise

## 2018-12-24 MED ORDER — PAROXETINE HCL 10 MG PO TABS: 10.0000 mg | ORAL_TABLET | Freq: Every day | ORAL | 2 refills | Status: DC

## 2018-12-24 NOTE — Telephone Encounter (Signed)
That is okay with me. Stop the Escitalapram and call in Paroxitene 10 mg daily, #30 with 2 rf

## 2018-12-24 NOTE — Telephone Encounter (Signed)
Patient notified of update  and verbalized understanding. 

## 2018-12-30 ENCOUNTER — Telehealth: Payer: Self-pay

## 2018-12-30 NOTE — Telephone Encounter (Signed)
[facility]: Sherrilee Gilles - [place] Case ID: [id number] - Rx #[id number] Need help? Call us at [phone] Status Sent to [employer]

## 2018-12-31 NOTE — Telephone Encounter (Signed)
[facility] - [place] Case ID: [id number] - Rx #: [id number] Need help? Call us at [phone] Outcome [employer] PA Case: [id number], Status: Approved, Coverage Starts on: 03/31/2018 12:00:00 AM, Coverage Ends on: 03/30/2020 12:00:00 AM. Questions? Contact [phone]. Drug PARoxetine HCl 10MG  tablets Form [facility] Electronic PA Form Original Claim Info [phone] PA REQD CALL [phone] [facility] [phone] FOR DISASTER CLAIMS

## 2019-01-02 ENCOUNTER — Other Ambulatory Visit: Payer: Self-pay | Admitting: Family Medicine

## 2019-01-03 ENCOUNTER — Other Ambulatory Visit: Payer: Self-pay | Admitting: Family Medicine

## 2019-01-03 MED ORDER — PAROXETINE HCL 10 MG PO TABS: 10.0000 mg | ORAL_TABLET | Freq: Every day | ORAL | 0 refills | Status: DC

## 2019-01-03 NOTE — Telephone Encounter (Signed)
Copied from CRM #[id number]. Topic: General - Other >> Jan 03, 2019  9:55 AM Robinson, Norma J wrote: Reason for CRM: pt is calling checking on cologuard . Pt has not received the kit 

## 2019-01-03 NOTE — Telephone Encounter (Signed)
Spoke to pt and advised that another order will be faxed to [facility].

## 2019-01-03 NOTE — Telephone Encounter (Signed)
Pt is calling and paroxetine 10 mg #90 w/refills needs to go to [facility]

## 2019-01-12 ENCOUNTER — Telehealth: Payer: Self-pay

## 2019-01-12 NOTE — Telephone Encounter (Signed)
Recruitment consultant and spoke to Bells and gave correct Cologaurd Dx codes. No further action needed.

## 2019-01-12 NOTE — Telephone Encounter (Signed)
Copied from [place] [id number]. Topic: General - Other >> Jan 12, 2019 12:48 PM Leward Quan A wrote: Reason for CRM: Bethena Roys with [facility] called to request change of Dx code on specimen sent in for this patient from Dr Sarajane Jews. Asking for a call back at Ph# [phone] prompts to Provider support

## 2019-01-18 DIAGNOSIS — Z1211 Encounter for screening for malignant neoplasm of colon: Secondary | ICD-10-CM | POA: Diagnosis not present

## 2019-01-18 DIAGNOSIS — Z0000 Encounter for general adult medical examination without abnormal findings: Secondary | ICD-10-CM | POA: Diagnosis not present

## 2019-01-18 DIAGNOSIS — K921 Melena: Secondary | ICD-10-CM | POA: Diagnosis not present

## 2019-01-18 HISTORY — PX: COLONOSCOPY: SHX174

## 2019-01-24 LAB — COLOGUARD: Cologuard: POSITIVE — AB

## 2019-01-25 ENCOUNTER — Telehealth: Payer: Self-pay | Admitting: Family Medicine

## 2019-01-25 NOTE — Telephone Encounter (Signed)
Spoke to [place] from [employer] and she stated that pt Cologaurd test was positive.

## 2019-01-25 NOTE — Telephone Encounter (Signed)
Joy with [employer] called to see if we receive the results on this pt and stated that she will be refaxing it today.  And if we do not receive today to please call [phone] and press provider support and someone will come on the line.  Once someone comes on the line give them the order # [id number] and let them know that we have not received the results.

## 2019-01-26 NOTE — Addendum Note (Signed)
Addended by: Alysia Penna A on: 01/26/2019 02:43 PM   Modules accepted: Orders

## 2019-01-26 NOTE — Telephone Encounter (Signed)
Received Cologuard results back stating pt test was positive. Per Dr.Fry's written order after review " Tell him that I will refer him to GI to discuss other options from here".Pt was advise to return call if he does not hear anything in the next 2 weeks.  Pt verbalized understanding.   Faxed Cologuard has been placed in scans

## 2019-01-27 ENCOUNTER — Encounter: Payer: Self-pay | Admitting: Family Medicine

## 2019-02-28 ENCOUNTER — Other Ambulatory Visit: Payer: Self-pay | Admitting: Family Medicine

## 2019-02-28 ENCOUNTER — Encounter: Payer: Self-pay | Admitting: Gastroenterology

## 2019-03-21 ENCOUNTER — Telehealth: Payer: Self-pay | Admitting: Family Medicine

## 2019-03-21 MED ORDER — FEXOFENADINE-PSEUDOEPHED ER 180-240 MG PO TB24: 1.0000 | ORAL_TABLET | Freq: Every day | ORAL | 3 refills | Status: DC

## 2019-03-21 NOTE — Telephone Encounter (Signed)
Allegra - D 24 hour non drowsy- 90 days supply    Pharmacy:  [place], [place] Phone:  [phone]  Fax:  [phone]      [phone] fax#

## 2019-03-21 NOTE — Telephone Encounter (Signed)
Rx sent in

## 2019-03-30 ENCOUNTER — Other Ambulatory Visit: Payer: Self-pay | Admitting: Family Medicine

## 2019-03-30 ENCOUNTER — Other Ambulatory Visit: Payer: Self-pay | Admitting: *Deleted

## 2019-03-30 MED ORDER — FEXOFENADINE-PSEUDOEPHED ER 180-240 MG PO TB24: 1.0000 | ORAL_TABLET | Freq: Every day | ORAL | 3 refills | Status: DC

## 2019-03-30 NOTE — Telephone Encounter (Signed)
Patient states this was sent to the wrong pharmacy.     Patient requested it be sent to [facility], not local pharmacy.

## 2019-03-30 NOTE — Telephone Encounter (Signed)
Rx sent to [facility] as requested.

## 2019-05-31 ENCOUNTER — Telehealth: Payer: Self-pay | Admitting: Family Medicine

## 2019-05-31 NOTE — Telephone Encounter (Addendum)
[employer] stated they have medical questions about the  PA for Cyclobenzaprine 10 MG. They will be faxing it over and will need it completed and faxed back.   Will try to keep a look out for it.  Fax received and placed in red folder.

## 2019-06-01 NOTE — Telephone Encounter (Signed)
Noted. Will keep an eye out for the fax  

## 2019-06-07 ENCOUNTER — Encounter: Payer: Self-pay | Admitting: Family Medicine

## 2019-06-07 DIAGNOSIS — G8929 Other chronic pain: Secondary | ICD-10-CM | POA: Insufficient documentation

## 2019-06-07 DIAGNOSIS — M542 Cervicalgia: Secondary | ICD-10-CM | POA: Insufficient documentation

## 2019-06-09 ENCOUNTER — Other Ambulatory Visit: Payer: Self-pay

## 2019-06-10 ENCOUNTER — Ambulatory Visit (INDEPENDENT_AMBULATORY_CARE_PROVIDER_SITE_OTHER): Payer: Medicare HMO | Admitting: Family Medicine

## 2019-06-10 ENCOUNTER — Encounter: Payer: Self-pay | Admitting: Family Medicine

## 2019-06-10 VITALS — BP 130/78 | HR 71 | Temp 98.1°F | Ht 65.0 in

## 2019-06-10 DIAGNOSIS — L989 Disorder of the skin and subcutaneous tissue, unspecified: Secondary | ICD-10-CM | POA: Diagnosis not present

## 2019-06-10 DIAGNOSIS — F411 Generalized anxiety disorder: Secondary | ICD-10-CM

## 2019-06-10 DIAGNOSIS — I10 Essential (primary) hypertension: Secondary | ICD-10-CM | POA: Diagnosis not present

## 2019-06-10 NOTE — Progress Notes (Signed)
   Subjective:    Patient ID: Alejandro Hill, male    DOB: 01/04/1956, 64 y.o.   MRN: [id number]  HPI Here to discuss his anxiety situation. He has been living in a very stressful situation at home for several years. He says his wife is hard to live with because of extreme mood swings and erratic behavior. He had felt she was bipolar for a long time, and she finally saw a doctor a week ago and was g=diagnosed with bipolar disorder. She was started on a medication, and he hopes this will help. He had been taking Paxil, but he was concerned about possible side effects so he stopped it he is now taking several OTC supplements including one called Anxie-T which contains several herbs. These seem to be helping him to relax. He asks if it would be okay for him to take these. His BP has been stable. Also he asks me to check a lesion on the left eyebrow that has been present for several years. It gets tender when his eyeglasses rub against it.    Review of Systems  Constitutional: Negative.   Respiratory: Negative.   Cardiovascular: Negative.   Psychiatric/Behavioral: Negative for agitation, behavioral problems, confusion, decreased concentration, dysphoric mood and hallucinations. The patient is nervous/anxious.        Objective:   Physical Exam Constitutional:      Appearance: Normal appearance. He is obese.  Cardiovascular:     Rate and Rhythm: Normal rate and regular rhythm.     Pulses: Normal pulses.     Heart sounds: Normal heart sounds.  Pulmonary:     Effort: Pulmonary effort is normal.     Breath sounds: Normal breath sounds.  Musculoskeletal:     Right lower leg: No edema.     Left lower leg: No edema.  Skin:    Comments: There is a non-tender papular lesion on the left eyebrow   Neurological:     General: No focal deficit present.     Mental Status: He is alert.  Psychiatric:        Mood and Affect: Mood normal.        Thought Content: Thought content normal.        Judgment:  Judgment normal.           Assessment & Plan:  His anxiety seems to be doing well on the OTC supplements. I told him these were safe to take and he plans to stick with them for now. His HTN is stable. We will refer him to Dermatology for the skin lesion.  Alysia Penna, MD

## 2019-06-16 ENCOUNTER — Telehealth: Payer: Self-pay | Admitting: Family Medicine

## 2019-06-16 NOTE — Telephone Encounter (Signed)
Message Routed to PCP

## 2019-06-16 NOTE — Telephone Encounter (Signed)
Pt stated he has no problem getting his penis up but his wife believes he has Erectile dysfunction b/c he does not stay up long enough. He is only familiar with Viagra and cialis for the situation but he stated that when he was on a prescription (maybe Viagra) for this years ago and if he would drink wine and it would go straight through him and did not feel like it was affective b/c he noticed there was no change. He is not sure if he needed to wait to drink after taking it and that is why it was not effected. He would like advice on what kind of medications are out there that might help with his situation.    Pt can be reached at [phone]  Pharmacy: [place]: [phone]

## 2019-06-17 ENCOUNTER — Encounter: Payer: Self-pay | Admitting: *Deleted

## 2019-06-17 MED ORDER — TADALAFIL 20 MG PO TABS: 20.0000 mg | ORAL_TABLET | Freq: Every day | ORAL | 3 refills | Status: DC | PRN

## 2019-06-17 NOTE — Telephone Encounter (Signed)
I sent in Cialis for him to try

## 2019-06-17 NOTE — Telephone Encounter (Signed)
Patient notified

## 2019-07-05 DIAGNOSIS — D229 Melanocytic nevi, unspecified: Secondary | ICD-10-CM | POA: Diagnosis not present

## 2019-07-05 DIAGNOSIS — D1801 Hemangioma of skin and subcutaneous tissue: Secondary | ICD-10-CM | POA: Diagnosis not present

## 2019-07-05 DIAGNOSIS — D2239 Melanocytic nevi of other parts of face: Secondary | ICD-10-CM | POA: Diagnosis not present

## 2019-07-05 DIAGNOSIS — D485 Neoplasm of uncertain behavior of skin: Secondary | ICD-10-CM | POA: Diagnosis not present

## 2019-07-12 ENCOUNTER — Telehealth: Payer: Self-pay | Admitting: Family Medicine

## 2019-07-12 ENCOUNTER — Encounter: Payer: Self-pay | Admitting: Family Medicine

## 2019-07-12 NOTE — Telephone Encounter (Signed)
No I cannot do that. There is NO scientific evidence that this medication has any effect on the Covid virus, and it can cause serious side effects.

## 2019-07-12 NOTE — Telephone Encounter (Signed)
The patient is wanting Dr. Sarajane Jews to write a Rx for him to pick from the office for Ivermectin 3mg   1st dose 7 tablets 48 hours later is the 2nd dose 7 tablets 48 hours later is the 3rd dose 14 tablets 14 tablets weekly every week until the COVID virus is gone  He needs the Rx written for 6 mths supply or a year  He is also going to be taking supplements with this medication  quercitin 250 mg metotlin 6 mg VIT D 3,000 IU VIT C 500 - 1,000 IU 2 x day ZINC 30-40 mg  He's going to send you the article on this Rx through his MyChart  Please advise

## 2019-07-12 NOTE — Telephone Encounter (Signed)
Please advise I do not see this medication on the current med list.

## 2019-07-13 NOTE — Telephone Encounter (Signed)
I am sorry but I will not prescribe this. The vast majority of all medical professionals think this is not appropriate to prescribe

## 2019-08-03 ENCOUNTER — Other Ambulatory Visit: Payer: Self-pay | Admitting: Family Medicine

## 2019-10-11 ENCOUNTER — Other Ambulatory Visit: Payer: Self-pay | Admitting: Family Medicine

## 2019-11-03 ENCOUNTER — Other Ambulatory Visit: Payer: Self-pay | Admitting: Family Medicine

## 2020-01-10 ENCOUNTER — Telehealth: Payer: Self-pay | Admitting: Family Medicine

## 2020-01-10 DIAGNOSIS — L600 Ingrowing nail: Secondary | ICD-10-CM

## 2020-01-10 NOTE — Telephone Encounter (Addendum)
Pt needs a referral for ingrown toenail.  Needs it taken care of as soon as possible.  Would like to go to who he saw last time for his other foot.

## 2020-01-16 ENCOUNTER — Ambulatory Visit: Payer: Medicare HMO | Admitting: Podiatry

## 2020-01-17 ENCOUNTER — Ambulatory Visit: Payer: Medicare HMO | Admitting: Podiatry

## 2020-01-17 ENCOUNTER — Other Ambulatory Visit: Payer: Self-pay

## 2020-01-17 ENCOUNTER — Encounter: Payer: Self-pay | Admitting: Podiatry

## 2020-01-17 DIAGNOSIS — L600 Ingrowing nail: Secondary | ICD-10-CM | POA: Diagnosis not present

## 2020-01-17 DIAGNOSIS — Q666 Other congenital valgus deformities of feet: Secondary | ICD-10-CM

## 2020-01-17 NOTE — Progress Notes (Signed)
Subjective:  Patient ID: Alejandro Hill, male    DOB: [birth year],  MRN: [id number]  Chief Complaint  Patient presents with  . Ingrown Toenail    Patient presents today for ingrown toenail right hallux lateral border x 1 month   He also says he has some top of foot pain that comes and goes    64 y.o. male presents with the above complaint.  Patient presents with complaint of right hallux lateral border ingrown.  Patient states it hurts a lot when ambulating on it there is some redness associated with it.  Patient would like to know what it could be done to help with the ingrown.  He would like to have it removed.  He had it done on the other side previously.  Patient's tried soaking in Epson salt which has not helped.  He would like to have it removed he has not seen any was prior to seeing me.  He would also like to discuss his flatfoot and given that he is constantly on his feet he would like to know if there is any kind of orthotic device that could be utilized in his shoes to take the pressure away and allow him to work for long period of time his feet.  He denies any other acute complaints.   Review of Systems: Negative except as noted in the HPI. Denies N/V/F/Ch.  Past Medical History:  Diagnosis Date  . Anemia    as a child  . Asthma    exertional, seasonal (spring)  . Diabetes mellitus    type II diabetes    . Heart murmur    as a child  . Hemorrhoids   . Hyperlipidemia   . Hypertension   . Hypogonadism male    sees Dr. Diona Fanti  . Kidney stone   . Migraine    cluster, [facility]  . Pneumonia 2012  . Seasonal allergies   . Sleep apnea    , uses cpap  . Thyroid disease    hypothyroidism - not taking medications except for [employer]  . Umbilical hernia     Current Outpatient Medications:  .  Cholecalciferol (VITAMIN D-3) 125 MCG (5000 UT) TABS, Take 1 tablet by mouth daily., Disp: , Rfl:  .  Cinnamon 500 MG TABS, Take 2 tablets by mouth daily. ,  Disp: , Rfl:  .  fexofenadine-pseudoephedrine (ALLEGRA-D ALLERGY & CONGESTION) 180-240 MG 24 hr tablet, Take 1 tablet by mouth daily., Disp: 90 tablet, Rfl: 3 .  Garlic 662 MG CAPS, Take 2 capsules by mouth daily. , Disp: , Rfl:  .  ivermectin (STROMECTOL) 3 MG TABS tablet, , Disp: , Rfl:  .  KRILL OIL PO, Take by mouth daily., Disp: , Rfl:  .  LYSINE PO, Take 1 tablet by mouth daily. , Disp: , Rfl:  .  Multiple Vitamin (MULTIVITAMIN) tablet, Take 1 tablet by mouth daily., Disp: , Rfl:  .  NON FORMULARY, 5 mcg daily. Sea Kelp, Disp: , Rfl:  .  Red Yeast Rice Extract (RED YEAST RICE PO), Take 1 capsule by mouth 2 (two) times daily. , Disp: , Rfl:  .  UNABLE TO FIND, Use as directed 1 tablet in the mouth or throat with breakfast, with lunch, and with evening meal. Med Name: Roderic Ovens, Disp: , Rfl:  .  verapamil (CALAN-SR) 240 MG CR tablet, TAKE 1 TABLET (240 MG TOTAL) BY MOUTH DAILY., Disp: 90 tablet, Rfl: 3  Social History  Tobacco Use  Smoking Status Never Smoker  Smokeless Tobacco Never Used    Allergies  Allergen Reactions  . Penicillins Rash   Objective:  There were no vitals filed for this visit. There is no height or weight on file to calculate BMI. Constitutional Well developed. Well nourished.  Vascular Dorsalis pedis pulses palpable bilaterally. Posterior tibial pulses palpable bilaterally. Capillary refill normal to all digits.  No cyanosis or clubbing noted. Pedal hair growth normal.  Neurologic Normal speech. Oriented to person, place, and time. Epicritic sensation to light touch grossly present bilaterally.  Dermatologic Painful ingrowing nail at lateral nail borders of the hallux nail right. No other open wounds. No skin lesions.  Orthopedic:  Gait examination shows calcaneal valgus with too many toe signs partially recreate the arch with dorsiflexion of the hallux.  Unable to perform single and double heel raises.   Radiographs: None Assessment:   1. Pes  planovalgus   2. Ingrown toenail of right foot    Plan:  Patient was evaluated and treated and all questions answered.  Pes planovalgus semiflexible -I explained to the patient the etiology of pes planovalgus and various treatment options were extensively discussed.  I believe patient will benefit from custom-made orthotics to help control the hindfoot motion and support the arch of the foot and allow him to walk for long period of time on his feet. -He will be scheduled see rec for custom-made orthotics  Ingrown Nail, right -Patient elects to proceed with minor surgery to remove ingrown toenail removal today. Consent reviewed and signed by patient. -Ingrown nail excised. See procedure note. -Educated on post-procedure care including soaking. Written instructions provided and reviewed. -Patient to follow up in 2 weeks for nail check.  Procedure: Excision of Ingrown Toenail Location: Right 1st toe lateral nail borders. Anesthesia: Lidocaine 1% plain; 1.5 mL and Marcaine 0.5% plain; 1.5 mL, digital block. Skin Prep: Betadine. Dressing: Silvadene; telfa; dry, sterile, compression dressing. Technique: Following skin prep, the toe was exsanguinated and a tourniquet was secured at the base of the toe. The affected nail border was freed, split with a nail splitter, and excised. Chemical matrixectomy was then performed with phenol and irrigated out with alcohol. The tourniquet was then removed and sterile dressing applied. Disposition: Patient tolerated procedure well. Patient to return in 2 weeks for follow-up.   No follow-ups on file.

## 2020-02-15 ENCOUNTER — Other Ambulatory Visit: Payer: Medicare HMO | Admitting: Orthotics

## 2020-03-02 ENCOUNTER — Other Ambulatory Visit: Payer: Self-pay | Admitting: Family Medicine

## 2020-03-07 ENCOUNTER — Other Ambulatory Visit: Payer: Self-pay | Admitting: Family Medicine

## 2020-03-11 ENCOUNTER — Other Ambulatory Visit: Payer: Self-pay | Admitting: Family Medicine

## 2020-03-13 ENCOUNTER — Emergency Department (HOSPITAL_COMMUNITY)
Admission: EM | Admit: 2020-03-13 | Discharge: 2020-03-14 | Disposition: A | Discharge status: Home / Self Care | Payer: Medicare HMO | Attending: Emergency Medicine | Admitting: Emergency Medicine

## 2020-03-13 ENCOUNTER — Encounter (HOSPITAL_COMMUNITY): Payer: Self-pay | Admitting: Emergency Medicine

## 2020-03-13 DIAGNOSIS — E119 Type 2 diabetes mellitus without complications: Secondary | ICD-10-CM | POA: Diagnosis not present

## 2020-03-13 DIAGNOSIS — N1330 Unspecified hydronephrosis: Secondary | ICD-10-CM | POA: Diagnosis not present

## 2020-03-13 DIAGNOSIS — N2889 Other specified disorders of kidney and ureter: Secondary | ICD-10-CM

## 2020-03-13 DIAGNOSIS — K5900 Constipation, unspecified: Secondary | ICD-10-CM | POA: Diagnosis not present

## 2020-03-13 DIAGNOSIS — Z7952 Long term (current) use of systemic steroids: Secondary | ICD-10-CM | POA: Insufficient documentation

## 2020-03-13 DIAGNOSIS — R748 Abnormal levels of other serum enzymes: Secondary | ICD-10-CM | POA: Diagnosis not present

## 2020-03-13 DIAGNOSIS — J45909 Unspecified asthma, uncomplicated: Secondary | ICD-10-CM | POA: Diagnosis not present

## 2020-03-13 DIAGNOSIS — Z79899 Other long term (current) drug therapy: Secondary | ICD-10-CM | POA: Insufficient documentation

## 2020-03-13 DIAGNOSIS — N201 Calculus of ureter: Secondary | ICD-10-CM | POA: Diagnosis not present

## 2020-03-13 DIAGNOSIS — E039 Hypothyroidism, unspecified: Secondary | ICD-10-CM | POA: Insufficient documentation

## 2020-03-13 DIAGNOSIS — R188 Other ascites: Secondary | ICD-10-CM | POA: Diagnosis not present

## 2020-03-13 DIAGNOSIS — D72829 Elevated white blood cell count, unspecified: Secondary | ICD-10-CM | POA: Diagnosis not present

## 2020-03-13 DIAGNOSIS — Z87442 Personal history of urinary calculi: Secondary | ICD-10-CM | POA: Diagnosis not present

## 2020-03-13 DIAGNOSIS — N132 Hydronephrosis with renal and ureteral calculous obstruction: Secondary | ICD-10-CM | POA: Insufficient documentation

## 2020-03-13 DIAGNOSIS — D7389 Other diseases of spleen: Secondary | ICD-10-CM | POA: Diagnosis not present

## 2020-03-13 DIAGNOSIS — R1032 Left lower quadrant pain: Secondary | ICD-10-CM | POA: Diagnosis present

## 2020-03-13 DIAGNOSIS — D71 Functional disorders of polymorphonuclear neutrophils: Secondary | ICD-10-CM | POA: Diagnosis not present

## 2020-03-13 DIAGNOSIS — I10 Essential (primary) hypertension: Secondary | ICD-10-CM | POA: Diagnosis not present

## 2020-03-13 LAB — URINALYSIS, ROUTINE W REFLEX MICROSCOPIC
Bacteria, UA: NONE SEEN
Bilirubin Urine: NEGATIVE
Glucose, UA: NEGATIVE mg/dL
Hgb urine dipstick: NEGATIVE
Ketones, ur: 20 mg/dL — AB
Leukocytes,Ua: NEGATIVE
Nitrite: NEGATIVE
Protein, ur: 30 mg/dL — AB
Specific Gravity, Urine: 1.028 (ref 1.005–1.030)
pH: 5 (ref 5.0–8.0)

## 2020-03-13 LAB — CBC
HCT: 44.9 % (ref 39.0–52.0)
Hemoglobin: 15.8 g/dL (ref 13.0–17.0)
MCH: 31 pg (ref 26.0–34.0)
MCHC: 35.2 g/dL (ref 30.0–36.0)
MCV: 88.2 fL (ref 80.0–100.0)
Platelets: 233 10*3/uL (ref 150–400)
RBC: 5.09 MIL/uL (ref 4.22–5.81)
RDW: 12.6 % (ref 11.5–15.5)
WBC: 12.6 10*3/uL — ABNORMAL HIGH (ref 4.0–10.5)
nRBC: 0 % (ref 0.0–0.2)

## 2020-03-13 LAB — COMPREHENSIVE METABOLIC PANEL
ALT: 16 U/L (ref 0–44)
AST: 13 U/L — ABNORMAL LOW (ref 15–41)
Albumin: 3.2 g/dL — ABNORMAL LOW (ref 3.5–5.0)
Alkaline Phosphatase: 78 U/L (ref 38–126)
Anion gap: 15 (ref 5–15)
BUN: 25 mg/dL — ABNORMAL HIGH (ref 8–23)
CO2: 25 mmol/L (ref 22–32)
Calcium: 8.9 mg/dL (ref 8.9–10.3)
Chloride: 98 mmol/L (ref 98–111)
Creatinine, Ser: 1.67 mg/dL — ABNORMAL HIGH (ref 0.61–1.24)
GFR, Estimated: 45 mL/min — ABNORMAL LOW (ref >60)
Glucose, Bld: 120 mg/dL — ABNORMAL HIGH (ref 70–99)
Potassium: 3.7 mmol/L (ref 3.5–5.1)
Sodium: 138 mmol/L (ref 135–145)
Total Bilirubin: 1.1 mg/dL (ref 0.3–1.2)
Total Protein: 6.8 g/dL (ref 6.5–8.1)

## 2020-03-13 LAB — LIPASE, BLOOD: Lipase: 24 U/L (ref 11–51)

## 2020-03-13 NOTE — ED Triage Notes (Addendum)
Pt reports that Friday he began having LLQ pain, states that he normally has BMs everyday. Reports eating some chinese food and that night he became very sick with nausea and vomiting and increased abd pain. Reports that yesterday he began having L flank pain, last BM Friday morning, has tried fleet enema and laxative without relief. Denies urinary symptoms.

## 2020-03-14 ENCOUNTER — Emergency Department (HOSPITAL_COMMUNITY): Payer: Medicare HMO

## 2020-03-14 DIAGNOSIS — R188 Other ascites: Secondary | ICD-10-CM | POA: Diagnosis not present

## 2020-03-14 DIAGNOSIS — D71 Functional disorders of polymorphonuclear neutrophils: Secondary | ICD-10-CM | POA: Diagnosis not present

## 2020-03-14 DIAGNOSIS — N1330 Unspecified hydronephrosis: Secondary | ICD-10-CM | POA: Diagnosis not present

## 2020-03-14 DIAGNOSIS — D7389 Other diseases of spleen: Secondary | ICD-10-CM | POA: Diagnosis not present

## 2020-03-14 MED ORDER — KETOROLAC TROMETHAMINE 30 MG/ML IJ SOLN
10.0000 mg | Freq: Once | INTRAMUSCULAR | Status: AC
  Administered 2020-03-14: 9.9 mg via INTRAVENOUS
  Filled 2020-03-14: qty 1

## 2020-03-14 MED ORDER — SODIUM CHLORIDE 0.9 % IV BOLUS
1000.0000 mL | Freq: Once | INTRAVENOUS | Status: AC
  Administered 2020-03-14: 1000 mL via INTRAVENOUS

## 2020-03-14 MED ORDER — ONDANSETRON HCL 4 MG PO TABS: 4.0000 mg | ORAL_TABLET | Freq: Four times a day (QID) | ORAL | 0 refills | Status: DC

## 2020-03-14 MED ORDER — TAMSULOSIN HCL 0.4 MG PO CAPS: 0.4000 mg | ORAL_CAPSULE | Freq: Every day | ORAL | 0 refills | Status: AC

## 2020-03-14 MED ORDER — HYDROMORPHONE HCL 1 MG/ML IJ SOLN
0.5000 mg | Freq: Once | INTRAMUSCULAR | Status: AC
  Administered 2020-03-14: 0.5 mg via INTRAVENOUS
  Filled 2020-03-14: qty 1

## 2020-03-14 MED ORDER — HYDROCODONE-ACETAMINOPHEN 5-325 MG PO TABS: 1.0000 | ORAL_TABLET | Freq: Four times a day (QID) | ORAL | 0 refills | Status: DC | PRN

## 2020-03-14 MED ORDER — POLYETHYLENE GLYCOL 3350 17 G PO PACK: 17.0000 g | PACK | Freq: Every day | ORAL | 0 refills | Status: DC

## 2020-03-14 MED ORDER — IOHEXOL 300 MG/ML  SOLN
80.0000 mL | Freq: Once | INTRAMUSCULAR | Status: AC | PRN
  Administered 2020-03-14: 80 mL via INTRAVENOUS

## 2020-03-14 MED ORDER — ONDANSETRON HCL 4 MG/2ML IJ SOLN
4.0000 mg | Freq: Once | INTRAMUSCULAR | Status: AC
  Administered 2020-03-14: 4 mg via INTRAVENOUS
  Filled 2020-03-14: qty 2

## 2020-03-14 MED ORDER — ACETAMINOPHEN 325 MG PO TABS
650.0000 mg | ORAL_TABLET | Freq: Once | ORAL | Status: AC
  Administered 2020-03-14: 650 mg via ORAL
  Filled 2020-03-14: qty 2

## 2020-03-14 NOTE — ED Provider Notes (Signed)
[facility] EMERGENCY DEPARTMENT Provider Note   CSN: [id number] Arrival date & time: 03/13/20  1429     History Chief Complaint  Patient presents with  . Constipation    Alejandro Hill is a 64 y.o. male.  HPI    64 year old male with history of anemia, asthma, diabetes, heart murmur, hyperlipidemia, hypertension, nephrolithiasis, migraines, pneumonia, seasonal allergies, sleep apnea, thyroid disease, buccal hernia, presents the emergency department today for evaluation of abdominal pain.  States that he started having nausea and vomiting 6 days ago.  His wife had similar symptoms a few days prior and started the rest of his family.  He took some pain medication the next day and his nausea and vomiting seem to resolve.  Since taking the pain medication he has been constipated.  He is tried an over-the-counter laxative and also did a fleets enema without any output.  He also drank some apple juice.  He is able to have a small bowel movement in the waiting room.  Throughout this time he has continued to have left-sided abdominal pain.  It is located to the upper and lower part of the abdomen.  He denies any continued vomiting.  He denies any associated urinary symptoms or fevers.  Past Medical History:  Diagnosis Date  . Anemia    as a child  . Asthma    exertional, seasonal (spring)  . Diabetes mellitus    type II diabetes    . Heart murmur    as a child  . Hemorrhoids   . Hyperlipidemia   . Hypertension   . Hypogonadism male    sees Dr. Diona Fanti  . Kidney stone   . Migraine    cluster, [facility]  . Pneumonia 2012  . Seasonal allergies   . Sleep apnea    , uses cpap  . Thyroid disease    hypothyroidism - not taking medications except for [employer]  . Umbilical hernia     Patient Active Problem List   Diagnosis Date Noted  . Chronic neck pain 06/07/2019  . Anxiety disorder 08/25/2018  . Hemorrhoids, internal, with bleeding  02/20/2014  . Umbilical hernia 43/32/9518  . OSA (obstructive sleep apnea) 03/02/2013  . ASBESTOS EXPOSURE, HX OF 09/20/2009  . Diabetes mellitus without complication ([place]) 84/16/6063  . Hypothyroidism 06/02/2007  . Hyperlipemia, mixed 06/02/2007  . Essential hypertension 06/02/2007  . Asthma 06/02/2007    Past Surgical History:  Procedure Laterality Date  . CATARACT EXTRACTION Bilateral 01/2011   with lens implant  . COLONOSCOPY  02-19-09   per Dr. Deatra Ina, repeat in 10 yrs   . EYE SURGERY Bilateral    RK in his 31's, lasik when he was 85  . HEMORRHOID SURGERY N/A 02/20/2014   Procedure: HEMORRHOIDECTOMY;  Surgeon: Autumn Messing III, MD;  Location: [facility];  Service: General;  Laterality: N/A;  . HERNIA REPAIR  08/03/07   left inguinal herniorrhaphy. Dr Marlou Starks  . INSERTION OF MESH N/A 02/20/2014   Procedure: INSERTION OF MESH;  Surgeon: Autumn Messing III, MD;  Location: [facility];  Service: General;  Laterality: N/A;  . TONSILLECTOMY    . UMBILICAL HERNIA REPAIR  02/20/2014   & EXTERNAL HEMORRHOROIDS     DR TOTH  . UMBILICAL HERNIA REPAIR N/A 02/20/2014   Procedure: HERNIA REPAIR UMBILICAL ADULT;  Surgeon: Autumn Messing III, MD;  Location: [facility];  Service: General;  Laterality: N/A;  . UVULOPALATOPHARYNGOPLASTY     per Dr  Crossley  . VASECTOMY    . WISDOM TOOTH EXTRACTION         Family History  Problem Relation Age of Onset  . Heart disease Mother   . Liver disease Father   . Lung cancer Father   . Heart disease Other   . Hyperlipidemia Other   . Stroke Other     Social History   Tobacco Use  . Smoking status: Never Smoker  . Smokeless tobacco: Never Used  Substance Use Topics  . Alcohol use: Yes    Alcohol/week: 0.0 standard drinks    Comment: 1-2 drink per week  . Drug use: No    Home Medications Prior to Admission medications   Medication Sig Start Date End Date Taking? Authorizing Provider  ascorbic acid (VITAMIN C) 1000 MG tablet Take 1,000 mg by mouth daily.   Yes  [provider]  B Complex Vitamins (VITAMIN B COMPLEX PO) Take 1 tablet by mouth daily.   Yes [provider]  Cholecalciferol (VITAMIN D-3) 125 MCG (5000 UT) TABS Take 1 tablet by mouth daily.   Yes [provider]  Cinnamon 500 MG TABS Take 2 tablets by mouth daily.   Yes [provider]  fexofenadine-pseudoephedrine (ALLEGRA-D ALLERGY & CONGESTION) 180-240 MG 24 hr tablet Take 1 tablet by mouth daily. 03/30/19  Yes Laurey Morale, MD  fluticasone (FLONASE) 50 MCG/ACT nasal spray USE 2 SPRAYS INTO EACH NOSTRIL EVERY DAY Patient taking differently: Place 2 sprays into both nostrils daily as needed for allergies. 03/07/20  Yes Laurey Morale, MD  Garlic 952 MG CAPS Take 2 capsules by mouth daily.    Yes [provider]  ivermectin (STROMECTOL) 3 MG TABS tablet Take 12 mg by mouth every Sunday. 10/10/19  Yes [provider]  KRILL OIL PO Take 1 capsule by mouth daily.   Yes [provider]  LYSINE PO Take 1 tablet by mouth daily.    Yes [provider]  Multiple Vitamin (MULTIVITAMIN) tablet Take 1 tablet by mouth daily.   Yes [provider]  NON FORMULARY 5 mcg daily. Moore   Yes [provider]  OVER THE COUNTER MEDICATION Take 2 tablets by mouth daily. Triple strength curimin supplement   Yes [provider]  Red Yeast Rice Extract (RED YEAST RICE PO) Take 1 capsule by mouth 2 (two) times daily.    Yes [provider]  verapamil (CALAN-SR) 240 MG CR tablet TAKE 1 TABLET EVERY DAY Patient taking differently: Take 240 mg by mouth daily. 03/05/20  Yes Laurey Morale, MD  HYDROcodone-acetaminophen (NORCO/VICODIN) 5-325 MG tablet Take 1 tablet by mouth every 6 (six) hours as needed. 03/14/20   Kyrene Longan S, PA-C  ondansetron (ZOFRAN) 4 MG tablet Take 1 tablet (4 mg total) by mouth every 6 (six) hours. 03/14/20   Shantice Menger S, PA-C  polyethylene glycol (MIRALAX) 17 g packet Take 17 g  by mouth daily. Take 1/2 to 1 capful daily while taking pain medications to prevent constipation. 03/14/20   Woodrow Drab S, PA-C  tamsulosin (FLOMAX) 0.4 MG CAPS capsule Take 1 capsule (0.4 mg total) by mouth daily. 03/14/20 04/13/20  Sohail Capraro S, PA-C    Allergies    Cefuroxime axetil, Codeine, Erythromycin, Morphine and related, Ppd [tuberculin purified protein derivative], Sulfa antibiotics, Norco [hydrocodone-acetaminophen], and Penicillins  Review of Systems   Review of Systems  Constitutional: Negative for fever.  HENT: Negative for ear pain and sore throat.  Eyes: Negative for visual disturbance.  Respiratory: Negative for cough and shortness of breath.   Cardiovascular: Negative for chest pain.  Gastrointestinal: Positive for abdominal pain, constipation, nausea and vomiting. Negative for diarrhea.  Genitourinary: Positive for flank pain. Negative for dysuria and hematuria.  Musculoskeletal: Negative for back pain.  Skin: Negative for rash.  Neurological: Negative for headaches.  All other systems reviewed and are negative.   Physical Exam Updated Vital Signs BP (!) 189/82   Pulse 71   Temp 98.9 F (37.2 C)   Resp 16   Ht 5\' 6"  (1.676 m)   Wt 102.1 kg   SpO2 99%   BMI 36.32 kg/m   Physical Exam Vitals and nursing note reviewed.  Constitutional:      Appearance: He is well-developed and well-nourished.  HENT:     Head: Normocephalic and atraumatic.  Eyes:     Conjunctiva/sclera: Conjunctivae normal.  Cardiovascular:     Rate and Rhythm: Normal rate and regular rhythm.     Heart sounds: Normal heart sounds. No murmur heard.   Pulmonary:     Effort: Pulmonary effort is normal. No respiratory distress.     Breath sounds: Normal breath sounds. No wheezing, rhonchi or rales.  Abdominal:     General: Bowel sounds are decreased.     Palpations: Abdomen is soft.     Tenderness: There is abdominal tenderness in the left upper quadrant and left lower  quadrant. There is left CVA tenderness and guarding. There is no right CVA tenderness or rebound.  Musculoskeletal:        General: No edema.     Cervical back: Neck supple.  Skin:    General: Skin is warm and dry.  Neurological:     Mental Status: He is alert.  Psychiatric:        Mood and Affect: Mood and affect normal.     ED Results / Procedures / Treatments   Labs (all labs ordered are listed, but only abnormal results are displayed) Labs Reviewed  COMPREHENSIVE METABOLIC PANEL - Abnormal; Notable for the following components:      Result Value   Glucose, Bld 120 (*)    BUN 25 (*)    Creatinine, Ser 1.67 (*)    Albumin 3.2 (*)    AST 13 (*)    GFR, Estimated 45 (*)    All other components within normal limits  CBC - Abnormal; Notable for the following components:   WBC 12.6 (*)    All other components within normal limits  URINALYSIS, ROUTINE W REFLEX MICROSCOPIC - Abnormal; Notable for the following components:   APPearance HAZY (*)    Ketones, ur 20 (*)    Protein, ur 30 (*)    All other components within normal limits  LIPASE, BLOOD    EKG None  Radiology CT ABDOMEN PELVIS W CONTRAST  Result Date: 03/14/2020 CLINICAL DATA:  Left lower quadrant pain, left flank pain EXAM: CT ABDOMEN AND PELVIS WITH CONTRAST TECHNIQUE: Multidetector CT imaging of the abdomen and pelvis was performed using the standard protocol following bolus administration of intravenous contrast. CONTRAST:  65mL OMNIPAQUE IOHEXOL 300 MG/ML  SOLN COMPARISON:  None. FINDINGS: Lower chest: Bibasilar atelectasis.  Trace left pleural effusion. Hepatobiliary: No focal hepatic abnormality. Gallbladder unremarkable. Pancreas: No focal abnormality or ductal dilatation. Spleen: Calcifications throughout the spleen.  Normal size. Adrenals/Urinary Tract: Left hydronephrosis and perinephric stranding. Fluid noted around the proximal ureter. 5 mm calcification likely reflects a proximal to mid  left ureteral  stone. Left ureter is difficult to follow due to the surrounding perinephric and Peri ureteral fluid, likely related to perforation of the urinary tract. Parapelvic cysts on the right. Adrenal glands and urinary bladder unremarkable. Stomach, large and small bowel grossly unremarkable. Stomach/Bowel: Stomach, large and small bowel grossly unremarkable. Vascular/Lymphatic: No evidence of aneurysm or adenopathy. Reproductive: No visible focal abnormality. Other: No free fluid or free air. Musculoskeletal: No acute bony abnormality. IMPRESSION: Moderate left hydronephrosis. Extensive perinephric and Peri ureteral stranding and fluid, likely related to caliceal or proximal ureteral perforation. 5 mm calcification likely reflects a proximal to mid left ureteral stone. Trace left pleural effusion. Bibasilar atelectasis. Old granulomatous disease in the spleen. Electronically Signed   By: Rolm Baptise M.D.   On: 03/14/2020 02:55    Procedures Procedures (including critical care time)  Medications Ordered in ED Medications  sodium chloride 0.9 % bolus 1,000 mL (1,000 mLs Intravenous New Bag/Given 03/14/20 0212)  ketorolac (TORADOL) 30 MG/ML injection 9.9 mg (9.9 mg Intravenous Given 03/14/20 0212)  acetaminophen (TYLENOL) tablet 650 mg (650 mg Oral Given 03/14/20 0212)  iohexol (OMNIPAQUE) 300 MG/ML solution 80 mL (80 mLs Intravenous Contrast Given 03/14/20 0232)  HYDROmorphone (DILAUDID) injection 0.5 mg (0.5 mg Intravenous Given 03/14/20 0358)  ondansetron (ZOFRAN) injection 4 mg (4 mg Intravenous Given 03/14/20 0358)    ED Course  I have reviewed the triage vital signs and the nursing notes.  Pertinent labs & imaging results that were available during my care of the patient were reviewed by me and considered in my medical decision making (see chart for details).    MDM Rules/Calculators/A&P                          64 year old male presenting the emergency department today complaining of  left-sided abdominal pain ongoing for about 4 to 5 days  Reviewed/interpreted labs CBC shows a mild leukocytosis, no anemia CMP shows elevated BUN/creatinine at 25/1.67, this is worse from prior, remainder of CMP is unremarkable Lipase is negative UA with no hematuria no evidence for infection  CT abdomen/pelvis reviewed/interpreted -  Moderate left hydronephrosis. Extensive perinephric and Peri ureteral stranding and fluid, likely related to caliceal or proximal ureteral perforation. 5 mm calcification likely reflects a proximal to mid left ureteral stone. Trace left pleural effusion. Bibasilar atelectasis. Old granulomatous disease in the spleen.  3:30 AM CONSULT with Dr. Tresa Moore with urology. Discussed patient's presentation, lab findings (including AKI), imaging studies Including CT abd/pelvis which showed evidence of ureteral perforation. He states that if patient is not septic and tolerating PO, then he is appropriate for discharge home with tamsulosin and pain meds.  Reassessed pt. He is feeling some improvement in pain after meds and is able to tolerate po. We discussed results and plan for d/c home. Advised on plan for urology f/u and strict return precautions. He voices understanding of the plan and reasons to return. All questions answered, pt stable for discharge    Final Clinical Impression(s) / ED Diagnoses Final diagnoses:  Ureteral stone  Ureter perforation  Elevated creatine kinase    Rx / DC Orders ED Discharge Orders         Ordered    ondansetron (ZOFRAN) 4 MG tablet  Every 6 hours        03/14/20 0412    tamsulosin (FLOMAX) 0.4 MG CAPS capsule  Daily        03/14/20 0412  HYDROcodone-acetaminophen (NORCO/VICODIN) 5-325 MG tablet  Every 6 hours PRN        03/14/20 0412    polyethylene glycol (MIRALAX) 17 g packet  Daily        03/14/20 0412           Rodney Booze, PA-C 03/14/20 1464    Fatima Blank, MD 03/14/20 1807

## 2020-03-14 NOTE — Discharge Instructions (Signed)
Today you were diagnosed with a kidney stone on your CT scan.  You will be given a prescription for Flomax, pain medication, and nausea medication.  You should not drive, work, or operate machinery while taking the pain medication as it can make you very drowsy. You were also given a prescription for miralax to help prevent any constipation from the pain medicine.    You will need to follow-up with urology for reevaluation and for further treatment of your kidney stone and ruptured ureter. You will also need to have your kidney function rechecked within the next week. You will need to return to the emergency department for any fevers, persistent pain, persistent vomiting, inability to urinate, or any new or worsening symptoms.

## 2020-03-16 DIAGNOSIS — N202 Calculus of kidney with calculus of ureter: Secondary | ICD-10-CM | POA: Diagnosis not present

## 2020-03-20 DIAGNOSIS — N202 Calculus of kidney with calculus of ureter: Secondary | ICD-10-CM | POA: Diagnosis not present

## 2020-03-21 ENCOUNTER — Other Ambulatory Visit: Payer: Self-pay | Admitting: Urology

## 2020-03-21 DIAGNOSIS — N201 Calculus of ureter: Secondary | ICD-10-CM

## 2020-03-22 NOTE — Progress Notes (Signed)
Pre op phone call completed.  Pt will arrive at 0700 Monday morning.  Pt aware of instruction for litho.

## 2020-03-26 ENCOUNTER — Ambulatory Visit (HOSPITAL_BASED_OUTPATIENT_CLINIC_OR_DEPARTMENT_OTHER)
Admission: RE | Admit: 2020-03-26 | Discharge: 2020-03-26 | Disposition: A | Discharge status: Home / Self Care | Payer: Medicare HMO | Attending: Urology | Admitting: Urology

## 2020-03-26 ENCOUNTER — Encounter (HOSPITAL_BASED_OUTPATIENT_CLINIC_OR_DEPARTMENT_OTHER)
Admission: RE | Disposition: A | Discharge status: Home / Self Care | Payer: Self-pay | Source: Home / Self Care | Attending: Urology

## 2020-03-26 ENCOUNTER — Encounter (HOSPITAL_BASED_OUTPATIENT_CLINIC_OR_DEPARTMENT_OTHER): Payer: Self-pay | Admitting: Urology

## 2020-03-26 ENCOUNTER — Ambulatory Visit (HOSPITAL_COMMUNITY): Payer: Medicare HMO

## 2020-03-26 ENCOUNTER — Other Ambulatory Visit: Payer: Self-pay

## 2020-03-26 DIAGNOSIS — Z882 Allergy status to sulfonamides status: Secondary | ICD-10-CM | POA: Diagnosis not present

## 2020-03-26 DIAGNOSIS — Z87442 Personal history of urinary calculi: Secondary | ICD-10-CM | POA: Diagnosis not present

## 2020-03-26 DIAGNOSIS — Z8379 Family history of other diseases of the digestive system: Secondary | ICD-10-CM | POA: Insufficient documentation

## 2020-03-26 DIAGNOSIS — Z880 Allergy status to penicillin: Secondary | ICD-10-CM | POA: Diagnosis not present

## 2020-03-26 DIAGNOSIS — K6389 Other specified diseases of intestine: Secondary | ICD-10-CM | POA: Diagnosis not present

## 2020-03-26 DIAGNOSIS — K567 Ileus, unspecified: Secondary | ICD-10-CM | POA: Diagnosis not present

## 2020-03-26 DIAGNOSIS — I878 Other specified disorders of veins: Secondary | ICD-10-CM | POA: Diagnosis not present

## 2020-03-26 DIAGNOSIS — Z20822 Contact with and (suspected) exposure to covid-19: Secondary | ICD-10-CM | POA: Diagnosis not present

## 2020-03-26 DIAGNOSIS — N201 Calculus of ureter: Secondary | ICD-10-CM

## 2020-03-26 HISTORY — PX: EXTRACORPOREAL SHOCK WAVE LITHOTRIPSY: SHX1557

## 2020-03-26 LAB — RESP PANEL BY RT-PCR (FLU A&B, COVID) ARPGX2
Influenza A by PCR: NEGATIVE
Influenza B by PCR: NEGATIVE
SARS Coronavirus 2 by RT PCR: NEGATIVE

## 2020-03-26 SURGERY — LITHOTRIPSY, ESWL
Anesthesia: LOCAL | Laterality: Left

## 2020-03-26 MED ORDER — DIPHENHYDRAMINE HCL 25 MG PO CAPS
ORAL_CAPSULE | ORAL | Status: AC
  Filled 2020-03-26: qty 1

## 2020-03-26 MED ORDER — OXYCODONE HCL 5 MG PO TABS: 5.0000 mg | ORAL_TABLET | Freq: Four times a day (QID) | ORAL | 0 refills | Status: DC | PRN

## 2020-03-26 MED ORDER — DIPHENHYDRAMINE HCL 25 MG PO CAPS
25.0000 mg | ORAL_CAPSULE | ORAL | Status: AC
  Administered 2020-03-26: 25 mg via ORAL

## 2020-03-26 MED ORDER — CIPROFLOXACIN HCL 500 MG PO TABS
500.0000 mg | ORAL_TABLET | ORAL | Status: AC
  Administered 2020-03-26: 500 mg via ORAL

## 2020-03-26 MED ORDER — DIAZEPAM 5 MG PO TABS
10.0000 mg | ORAL_TABLET | ORAL | Status: AC
  Administered 2020-03-26: 10 mg via ORAL

## 2020-03-26 MED ORDER — SODIUM CHLORIDE 0.9 % IV SOLN: INTRAVENOUS | Status: DC

## 2020-03-26 MED ORDER — DIAZEPAM 5 MG PO TABS
ORAL_TABLET | ORAL | Status: AC
  Filled 2020-03-26: qty 2

## 2020-03-26 MED ORDER — CIPROFLOXACIN HCL 500 MG PO TABS
ORAL_TABLET | ORAL | Status: AC
  Filled 2020-03-26: qty 1

## 2020-03-26 NOTE — H&P (Signed)
64 year old male who presents today for follow-up. He was seen on Friday by Dr. Diona Fanti had for a left-sided 5 mm stone. This was diagnosed on a CT scan from the emergency department on December 15th, 2 days prior to his initial presentation to our clinic. That time the patient underwent a KUB which demonstrated a mid ureteral stone on the left. The patient was counseled on treatment options and opted to proceed with conservative management. The patient's has done reasonably well with the pain no fevers, chills, dysuria, or gross hematuria. He presents today for a management options.     ALLERGIES: Penicillins Sulfa    MEDICATIONS: Cinnamon CAPS Oral  Curamin  Garlic CAPS Oral  Ivermectin  Krill Oil  Multi Vitamin/Minerals Oral Tablet 0 Oral  Oxycodone Hcl 10 mg tablet 1 tablet PO q 6 hrs prn pain  Red Yeast Rice CAPS Oral  Verapamil HCl ER 240 MG Oral Capsule Extended Release 24 Hour Oral  Vitamin D3 1,250 mcg (50,000 unit) capsule 0 Oral     GU PSH: No GU PSH      PSH Notes: Eye Surgery, Tonsillectomy Over Age 15   NON-GU PSH: Tonsillectomy.. - 2012     GU PMH: Renal and ureteral calculus, Pt CT reviewed and he has a stone located approximately in mid left ureter (adjacent to L5). - 03/16/2020 ED due to arterial insufficiency (Stable) - 2017 Primary hypogonadism (Stable, Chronic), He is sufficiently replaced with testosterone cypionate, 100 mg IM weekly. - 2017, Hypogonadism, testicular, - 2016 Encounter for Prostate Cancer screening, Prostate cancer screening - 2016 Male ED, unspecified, Erectile dysfunction - 2016 Personal Hx Oth male genital organs diseases, History of epididymitis - 2016    NON-GU PMH: Encounter for general adult medical examination without abnormal findings, Encounter for preventive health examination - 2016 Allergy status to unsp drug/meds/biol subst, Allergies - 2014 Asthma, Asthma - 2014 Personal history of other diseases of the circulatory system,  History of hypertension - 2014 Personal history of other endocrine, nutritional and metabolic disease, History of hyperlipidemia - 2014 Anxiety Hypertension    FAMILY HISTORY: Acute Myocardial Infarction - Runs In Family Death - Father Death of family member - Runs In Family Diabetes - Runs In Family irritable bowel syndrome - Runs In Family Prostate Cancer - Grandfather   SOCIAL HISTORY: Marital Status: Married Preferred Language: English; Ethnicity: Not Hispanic Or Latino; Race: White Current Smoking Status: Patient has never smoked.  Does not use smokeless tobacco. Social Drinker.  Does not use drugs. Drinks 3 caffeinated drinks per day. Has not had a blood transfusion. 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     Notes: Caffeine Use, Alcohol Use, Never A Smoker, Marital History - Currently Married   REVIEW OF SYSTEMS:    GU Review Male:   Patient denies frequent urination, hard to postpone urination, burning/ pain with urination, get up at night to urinate, leakage of urine, stream starts and stops, trouble starting your stream, have to strain to urinate , erection problems, and penile pain.  Gastrointestinal (Upper):   Patient denies nausea, vomiting, and indigestion/ heartburn.  Gastrointestinal (Lower):   Patient denies diarrhea and constipation.  Constitutional:   Patient denies fever, night sweats, weight loss, and fatigue.  Skin:   Patient denies skin rash/ lesion and itching.  Eyes:   Patient denies blurred vision and double vision.  Ears/ Nose/ Throat:   Patient denies sore throat and sinus problems.  Hematologic/Lymphatic:   Patient denies swollen glands and  easy bruising.  Cardiovascular:   Patient denies leg swelling and chest pains.  Respiratory:   Patient denies cough and shortness of breath.  Endocrine:   Patient denies excessive thirst.  Musculoskeletal:   Patient denies back pain and joint pain.  Neurological:   Patient denies headaches and  dizziness.  Psychologic:   Patient denies depression and anxiety.   VITAL SIGNS:      03/20/2020 12:27 PM  Weight 225 lb / 102.06 kg  Height 66 in / 167.64 cm  BP 171/76 mmHg  Pulse 71 /min  BMI 36.3 kg/m   MULTI-SYSTEM PHYSICAL EXAMINATION:    Constitutional: Well-nourished. No physical deformities. Normally developed. Good grooming.  Respiratory: Normal breath sounds. No labored breathing, no use of accessory muscles.   Cardiovascular: Regular rate and rhythm. No murmur, no gallop. Normal temperature, normal extremity pulses, no swelling, no varicosities.   Gastrointestinal: Left-sided CVA tenderness.     Complexity of Data:  Records Review:   Previous Doctor Records, Previous Patient Records  Urine Test Review:   Urinalysis  X-Ray Review: KUB: Reviewed Films. Discussed With Patient.  C.T. Abdomen/Pelvis: Reviewed Films. Discussed With Patient.     11/28/15 11/23/14 02/03/14 06/10/13 11/26/12 05/10/12 10/22/11 05/20/11  PSA  Total PSA 0.78 ng/dl 1.28  1.70  1.17  1.06  0.72  0.85  1.30     11/28/15 11/22/14 02/02/14 06/13/13 11/26/12 05/10/12 10/21/11 05/20/11  Hormones  Testosterone, Total 277.9 pg/dL 480  896  379  556  157  242.93  258.00     PROCEDURES:         KUB - 74018  A single view of the abdomen is obtained. Renal shadows are easily visualized bilaterally. There are no stones appreciated within the expected location in either renal pelvis. There is a calcification right at the L4 transverse process on the left side the expected location of the patient's left ureter Gas pattern is grossly normal. No significant bony abnormalities.      Impression: The patient has a stone in the left ureter at the transverse process of L4 which is not moved over the last 5 days.  Patient confirmed No Neulasta OnPro Device.            Urinalysis - 81003 Dipstick Dipstick Cont'd  Color: Yellow Bilirubin: Neg  Appearance: Clear Ketones: Neg  Specific Gravity: 1.025  Blood: Neg  pH: 7.5 Protein: Neg  Glucose: Neg Urobilinogen: 0.2    Nitrites: Neg    Leukocyte Esterase: Neg    Notes:      ASSESSMENT:      ICD-10 Details  1 GU:   Renal and ureteral calculus - N20.2    PLAN:            Medications New Meds: Oxycodone Hcl 10 mg tablet 1/2 tablet PO Q 6 H PRN   #20  0 Refill(s)            Orders X-Rays: KUB          Schedule         Document Letter(s):  Created for Patient: Clinical Summary         Notes:   The patient's KUB today was obscured because of gas, but it appears that his stone is in the exact location that it was on the initial KUB on December 17th which is also a similar location from a CT scan on December 15th indicating that the stone is not progressed. The patient is interested in medical  expulsion therapy in the short term with plans to perform shockwave lithotripsy on Monday December 27 if the pain is not improved. Will get him scheduled for this. I refilled his pain prescription.

## 2020-03-26 NOTE — Op Note (Signed)
See [facility] OP note scanned into chart. Also because of the size, density, location and other factors that cannot be anticipated I feel this will likely be a staged procedure. This fact supersedes any indication in the scanned [facility] operative note to the contrary.  

## 2020-03-26 NOTE — Discharge Instructions (Signed)
See [facility] discharge instructions in chart.  

## 2020-03-26 NOTE — Progress Notes (Signed)
Reddened area with slight bruising left buttock from  Shockwave litho.

## 2020-03-27 ENCOUNTER — Encounter (HOSPITAL_BASED_OUTPATIENT_CLINIC_OR_DEPARTMENT_OTHER): Payer: Self-pay | Admitting: Urology

## 2020-04-10 DIAGNOSIS — N202 Calculus of kidney with calculus of ureter: Secondary | ICD-10-CM | POA: Diagnosis not present

## 2020-04-10 DIAGNOSIS — R1084 Generalized abdominal pain: Secondary | ICD-10-CM | POA: Diagnosis not present

## 2020-05-02 DIAGNOSIS — N202 Calculus of kidney with calculus of ureter: Secondary | ICD-10-CM | POA: Diagnosis not present

## 2020-05-17 ENCOUNTER — Telehealth: Payer: Self-pay | Admitting: Family Medicine

## 2020-05-17 NOTE — Telephone Encounter (Signed)
Left message for patient to call back and schedule Medicare Annual Wellness Visit (AWV) either virtually or in office. No detailed message left   Last AWVi  No information ; please schedule at anytime with [facility] Nurse Health Advisor 1 or 2   This should be a 45 minute visit. 

## 2020-06-26 DIAGNOSIS — N202 Calculus of kidney with calculus of ureter: Secondary | ICD-10-CM | POA: Diagnosis not present

## 2020-08-16 ENCOUNTER — Telehealth: Payer: Self-pay | Admitting: Family Medicine

## 2020-08-16 ENCOUNTER — Other Ambulatory Visit: Payer: Self-pay

## 2020-08-16 MED ORDER — FEXOFENADINE-PSEUDOEPHED ER 180-240 MG PO TB24: 1.0000 | ORAL_TABLET | Freq: Every day | ORAL | 0 refills | Status: DC

## 2020-08-16 NOTE — Telephone Encounter (Signed)
Left message for patient to call back and schedule Medicare Annual Wellness Visit (AWV) either virtually or in office.    AWV-I per [place] 07/30/19 please schedule at anytime with [facility] Nurse Health Advisor 1 or 2   This should be a 45 minute visit.  

## 2020-10-08 ENCOUNTER — Ambulatory Visit (INDEPENDENT_AMBULATORY_CARE_PROVIDER_SITE_OTHER): Payer: Medicare HMO

## 2020-10-08 DIAGNOSIS — Z0000 Encounter for general adult medical examination without abnormal findings: Secondary | ICD-10-CM | POA: Diagnosis not present

## 2020-10-08 NOTE — Progress Notes (Signed)
Subjective:   Alejandro Hill is a 65 y.o. male who presents for an Initial Medicare Annual Wellness Visit.   Virtual Visit via Video Note  I connected with Alejandro Hill by a video enabled telemedicine application and verified that I am speaking with the correct person using two identifiers.  Location: Patient: Home Provider: Office Persons participating in the virtual visit: patient, provider   I discussed the limitations of evaluation and management by telemedicine and the availability of in person appointments. The patient expressed understanding and agreed to proceed.     Randel Pigg ,LPN  Review of Systems    N/a       Objective:    There were no vitals filed for this visit. There is no height or weight on file to calculate BMI.  Advanced Directives 03/26/2020 02/21/2014 02/13/2014 12/07/2013  Does Patient Have a Medical Advance Directive? Yes No Yes No  Type of Advance Directive - - [place];Living will -  Does patient want to make changes to medical advance directive? No - Guardian declined - No - Patient declined -  Copy of Excel in Chart? - - No - copy requested -  Would patient like information on creating a medical advance directive? No - Patient declined Yes - Educational materials given - -    Current Medications (verified) Outpatient Encounter Medications as of 10/08/2020  Medication Sig   ascorbic acid (VITAMIN C) 1000 MG tablet Take 1,000 mg by mouth daily.   B Complex Vitamins (VITAMIN B COMPLEX PO) Take 1 tablet by mouth daily.   Cholecalciferol (VITAMIN D-3) 125 MCG (5000 UT) TABS Take 1 tablet by mouth daily.   Cinnamon 500 MG TABS Take 2 tablets by mouth daily.   fexofenadine-pseudoephedrine (ALLEGRA-D ALLERGY & CONGESTION) 180-240 MG 24 hr tablet Take 1 tablet by mouth daily.   fluticasone (FLONASE) 50 MCG/ACT nasal spray USE 2 SPRAYS INTO EACH NOSTRIL EVERY DAY (Patient taking differently: Place 2 sprays into  both nostrils daily as needed for allergies.)   Garlic 161 MG CAPS Take 2 capsules by mouth daily.    ivermectin (STROMECTOL) 3 MG TABS tablet Take 12 mg by mouth every Sunday.   KRILL OIL PO Take 1 capsule by mouth daily.   LYSINE PO Take 1 tablet by mouth daily.    montelukast (SINGULAIR) 10 MG tablet TAKE 1 TABLET AT BEDTIME   Multiple Vitamin (MULTIVITAMIN) tablet Take 1 tablet by mouth daily.   NON FORMULARY 5 mcg daily. [place]   ondansetron (ZOFRAN) 4 MG tablet Take 1 tablet (4 mg total) by mouth every 6 (six) hours.   OVER THE COUNTER MEDICATION Take 2 tablets by mouth daily. Triple strength curimin supplement   oxyCODONE (OXY IR/ROXICODONE) 5 MG immediate release tablet Take 1-2 tablets (5-10 mg total) by mouth every 6 (six) hours as needed for severe pain.   polyethylene glycol (MIRALAX) 17 g packet Take 17 g by mouth daily. Take 1/2 to 1 capful daily while taking pain medications to prevent constipation.   Red Yeast Rice Extract (RED YEAST RICE PO) Take 1 capsule by mouth 2 (two) times daily.    verapamil (CALAN-SR) 240 MG CR tablet TAKE 1 TABLET EVERY DAY (Patient taking differently: Take 240 mg by mouth daily.)   No facility-administered encounter medications on file as of 10/08/2020.    Allergies (verified) Cefuroxime axetil, Codeine, Erythromycin, Morphine and related, Ppd [tuberculin purified protein derivative], Sulfa antibiotics, Norco [hydrocodone-acetaminophen], and Penicillins  History: Past Medical History:  Diagnosis Date   Anemia    as a child   Asthma    exertional, seasonal (spring)   Diabetes mellitus    type II diabetes     Heart murmur    as a child   Hemorrhoids    Hyperlipidemia    Hypertension    Hypogonadism male    sees Dr. Diona Fanti   Kidney stone    Migraine    cluster, had [facility]   Pneumonia 2012   Seasonal allergies    Sleep apnea    , uses cpap   Thyroid disease    hypothyroidism - not taking medications  except for [facility]   Umbilical hernia    Past Surgical History:  Procedure Laterality Date   CATARACT EXTRACTION Bilateral 01/2011   with lens implant   COLONOSCOPY  02-19-09   per Dr. Deatra Ina, repeat in 10 yrs    [place] LITHOTRIPSY Left 03/26/2020   Procedure: EXTRACORPOREAL SHOCK WAVE LITHOTRIPSY (ESWL);  Surgeon: Ardis Hughs, MD;  Location: [facility];  Service: Urology;  Laterality: Left;   EYE SURGERY Bilateral    RK in his 21's, lasik when he was [place] N/A 02/20/2014   Procedure: HEMORRHOIDECTOMY;  Surgeon: Autumn Messing III, MD;  Location: [place];  Service: General;  Laterality: N/A;   HERNIA REPAIR  08/03/07   left inguinal herniorrhaphy. Dr Marlou Starks   INSERTION OF MESH N/A 02/20/2014   Procedure: INSERTION OF MESH;  Surgeon: Autumn Messing III, MD;  Location: [place];  Service: General;  Laterality: N/A;   TONSILLECTOMY     UMBILICAL HERNIA REPAIR  02/20/2014   & EXTERNAL HEMORRHOROIDS     DR TOTH   UMBILICAL HERNIA REPAIR N/A 02/20/2014   Procedure: HERNIA REPAIR UMBILICAL ADULT;  Surgeon: Autumn Messing III, MD;  Location: [facility];  Service: General;  Laterality: N/A;   UVULOPALATOPHARYNGOPLASTY     per Dr Ernesto Rutherford   VASECTOMY     WISDOM TOOTH EXTRACTION     Family History  Problem Relation Age of Onset   Heart disease Mother    Liver disease Father    Lung cancer Father    Heart disease Other    Hyperlipidemia Other    Stroke Other    Social History   Socioeconomic History   Marital status: Married    Spouse name: Not on file   Number of children: Not on file   Years of education: Not on file   Highest education level: Not on file  Occupational History   Occupation: self employed  Tobacco Use   Smoking status: Never   Smokeless tobacco: Never  Substance and Sexual Activity   Alcohol use: Yes    Alcohol/week: 0.0 standard drinks    Comment: 1-2 drink per week   Drug use: No   Sexual activity: Not on file  Other  Topics Concern   Not on file  Social History Narrative   Regular Exercise:  Yes   Religion affecting care (Jehovah"s Witness) so will not accept blood products.   Social Determinants of Health   Financial Resource Strain: Not on file  Food Insecurity: Not on file  Transportation Needs: Not on file  Physical Activity: Not on file  Stress: Not on file  Social Connections: Not on file    Tobacco Counseling Counseling given: Not Answered   Clinical Intake:  Diabetic?yes Nutrition Risk Assessment:  Has the patient had any N/V/D within the last 2 months?  No  Does the patient have any non-healing wounds?  No  Has the patient had any unintentional weight loss or weight gain?  No   Diabetes:  Is the patient diabetic?  Yes  If diabetic, was a CBG obtained today?  No  Did the patient bring in their glucometer from home?  No  How often do you monitor your CBG's? never.   Financial Strains and Diabetes Management:  Are you having any financial strains with the device, your supplies or your medication? No .  Does the patient want to be seen by Chronic Care Management for management of their diabetes?  No  Would the patient like to be referred to a Nutritionist or for Diabetic Management?  No   Diabetic Exams:  Diabetic Eye Exam: Overdue for diabetic eye exam. Pt has been advised about the importance in completing this exam. Patient advised to call and schedule an eye exam. Diabetic Foot Exam: Overdue, Pt has been advised about the importance in completing this exam. Pt is scheduled for diabetic foot exam on next office visit .          Activities of Daily Living In your present state of health, do you have any difficulty performing the following activities: 03/26/2020  Hearing? N  Vision? N  Difficulty concentrating or making decisions? N  Walking or climbing stairs? Y  Dressing or bathing? N  Some recent data might be hidden    Patient Care  Team: Laurey Morale, MD as PCP - General Shaune Leeks, Jens Som, MD as Referring Physician (Allergy)  Indicate any recent Medical Services you may have received from other than [facility] providers in the past year (date may be approximate).     Assessment:   This is a routine wellness examination for [place].  Hearing/Vision screen No results found.  Dietary issues and exercise activities discussed:     Goals Addressed   None    Depression Screen No flowsheet data found.  Fall Risk No flowsheet data found.  FALL RISK PREVENTION PERTAINING TO THE HOME:  Any stairs in or around the home? No  If so, are there any without handrails? No  Home free of loose throw rugs in walkways, pet beds, electrical cords, etc? No  Adequate lighting in your home to reduce risk of falls? No   ASSISTIVE DEVICES UTILIZED TO PREVENT FALLS:  Life alert? No  Use of a cane, walker or w/c? No  Grab bars in the bathroom? No  Shower chair or bench in shower? No  Elevated toilet seat or a handicapped toilet? No     Cognitive Function:  Normal cognitive status assessed by direct observation by this Nurse Health Advisor. No abnormalities found.        Immunizations  There is no immunization history on file for this patient.  TDAP status: Due, Education has been provided regarding the importance of this vaccine. Advised may receive this vaccine at local pharmacy or Health Dept. Aware to provide a copy of the vaccination record if obtained from local pharmacy or Health Dept. Verbalized acceptance and understanding.  Flu Vaccine status: Declined, Education has been provided regarding the importance of this vaccine but patient still declined. Advised may receive this vaccine at local pharmacy or Health Dept. Aware to provide a copy of the vaccination record if obtained from local pharmacy or Health Dept. Verbalized acceptance and understanding.  Pneumococcal  vaccine status: Declined,  Education has been  provided regarding the importance of this vaccine but patient still declined. Advised may receive this vaccine at local pharmacy or Health Dept. Aware to provide a copy of the vaccination record if obtained from local pharmacy or Health Dept. Verbalized acceptance and understanding.   Covid-19 vaccine status: Declined, Education has been provided regarding the importance of this vaccine but patient still declined. Advised may receive this vaccine at local pharmacy or Health Dept.or vaccine clinic. Aware to provide a copy of the vaccination record if obtained from local pharmacy or Health Dept. Verbalized acceptance and understanding.  Qualifies for Shingles Vaccine? Yes   Zostavax completed No   Shingrix Completed?: No.    Education has been provided regarding the importance of this vaccine. Patient has been advised to call insurance company to determine out of pocket expense if they have not yet received this vaccine. Advised may also receive vaccine at local pharmacy or Health Dept. Verbalized acceptance and understanding.  Screening Tests Health Maintenance  Topic Date Due   COVID-19 Vaccine (1) Never done   FOOT EXAM  Never done   OPHTHALMOLOGY EXAM  Never done   Hepatitis C Screening  Never done   TETANUS/TDAP  Never done   Zoster Vaccines- Shingrix (1 of 2) Never done   URINE MICROALBUMIN  01/26/2015   PNA vac Low Risk Adult (1 of 2 - PCV13) Never done   COLONOSCOPY (Pts 45-35yrs Insurance coverage will need to be confirmed)  02/20/2019   HEMOGLOBIN A1C  04/21/2019   HPV Gypsy Maintenance  Health Maintenance Due  Topic Date Due   COVID-19 Vaccine (1) Never done   FOOT EXAM  Never done   OPHTHALMOLOGY EXAM  Never done   Hepatitis C Screening  Never done   TETANUS/TDAP  Never done   Zoster Vaccines- Shingrix (1 of 2) Never done   URINE MICROALBUMIN  01/26/2015   PNA vac Low Risk Adult (1 of 2 - PCV13) Never done   COLONOSCOPY (Pts 45-109yrs Insurance  coverage will need to be confirmed)  02/20/2019   HEMOGLOBIN A1C  04/21/2019    Colorectal cancer screening: Type of screening: Cologuard. Completed 02/07/2019. Repeat every 3 years  Lung Cancer Screening: (Low Dose CT Chest recommended if Age 25-80 years, 30 pack-year currently smoking OR have quit w/in 15years.) does not qualify.   Lung Cancer Screening Referral: n/a  Additional Screening:  Hepatitis C Screening: does qualify  Vision Screening: Recommended annual ophthalmology exams for early detection of glaucoma and other disorders of the eye. Is the patient up to date with their annual eye exam?  Yes  Who is the provider or what is the name of the office in which the patient attends annual eye exams? [facility]  If pt is not established with a provider, would they like to be referred to a provider to establish care? No .   Dental Screening: Recommended annual dental exams for proper oral hygiene  Community Resource Referral / Chronic Care Management: CRR required this visit?  No   CCM required this visit?  No      Plan:     I have personally reviewed and noted the following in the patient's chart:   Medical and social history Use of alcohol, tobacco or illicit drugs  Current medications and supplements including opioid prescriptions. Patient is not currently taking opioid prescriptions. Functional ability and status Nutritional status Physical activity Advanced directives  List of other physicians Hospitalizations, surgeries, and ER visits in previous 12 months Vitals Screenings to include cognitive, depression, and falls Referrals and appointments  In addition, I have reviewed and discussed with patient certain preventive protocols, quality metrics, and best practice recommendations. A written personalized care plan for preventive services as well as general preventive health recommendations were provided to patient.     Randel Pigg, LPN   [id number]    Nurse Notes: none

## 2020-10-08 NOTE — Patient Instructions (Signed)
Alejandro Hill , Thank you for taking time to come for your Medicare Wellness Visit. I appreciate your ongoing commitment to your health goals. Please review the following plan we discussed and let me know if I can assist you in the future.   Screening recommendations/referrals: Colonoscopy: cologuard  02/07/2019 Recommended yearly ophthalmology/optometry visit for glaucoma screening and checkup Recommended yearly dental visit for hygiene and checkup  Vaccinations: Influenza vaccine: declined  Pneumococcal vaccine: declined  Tdap vaccine: will obtain local pharmacy  Shingles vaccine: declined     Advanced directives: will provided   Conditions/risks identified: none   Next appointment: none   Preventive Care 3 Years and Older, Male Preventive care refers to lifestyle choices and visits with your health care provider that can promote health and wellness. What does preventive care include? A yearly physical exam. This is also called an annual well check. Dental exams once or twice a year. Routine eye exams. Ask your health care provider how often you should have your eyes checked. Personal lifestyle choices, including: Daily care of your teeth and gums. Regular physical activity. Eating a healthy diet. Avoiding tobacco and drug use. Limiting alcohol use. Practicing safe sex. Taking low doses of aspirin every day. Taking vitamin and mineral supplements as recommended by your health care provider. What happens during an annual well check? The services and screenings done by your health care provider during your annual well check will depend on your age, overall health, lifestyle risk factors, and family history of disease. Counseling  Your health care provider may ask you questions about your: Alcohol use. Tobacco use. Drug use. Emotional well-being. Home and relationship well-being. Sexual activity. Eating habits. History of falls. Memory and ability to understand  (cognition). Work and work Statistician. Screening  You may have the following tests or measurements: Height, weight, and BMI. Blood pressure. Lipid and cholesterol levels. These may be checked every 5 years, or more frequently if you are over 34 years old. Skin check. Lung cancer screening. You may have this screening every year starting at age 48 if you have a 30-pack-year history of smoking and currently smoke or have quit within the past 15 years. Fecal occult blood test (FOBT) of the stool. You may have this test every year starting at age 27. Flexible sigmoidoscopy or colonoscopy. You may have a sigmoidoscopy every 5 years or a colonoscopy every 10 years starting at age 24. Prostate cancer screening. Recommendations will vary depending on your family history and other risks. Hepatitis C blood test. Hepatitis B blood test. Sexually transmitted disease (STD) testing. Diabetes screening. This is done by checking your blood sugar (glucose) after you have not eaten for a while (fasting). You may have this done every 1-3 years. Abdominal aortic aneurysm (AAA) screening. You may need this if you are a current or former smoker. Osteoporosis. You may be screened starting at age 39 if you are at high risk. Talk with your health care provider about your test results, treatment options, and if necessary, the need for more tests. Vaccines  Your health care provider may recommend certain vaccines, such as: Influenza vaccine. This is recommended every year. Tetanus, diphtheria, and acellular pertussis (Tdap, Td) vaccine. You may need a Td booster every 10 years. Zoster vaccine. You may need this after age 29. Pneumococcal 13-valent conjugate (PCV13) vaccine. One dose is recommended after age 33. Pneumococcal polysaccharide (PPSV23) vaccine. One dose is recommended after age 84. Talk to your health care provider about which screenings and vaccines  you need and how often you need them. This  information is not intended to replace advice given to you by your health care provider. Make sure you discuss any questions you have with your health care provider. Document Released: 04/13/2015 Document Revised: 12/05/2015 Document Reviewed: 01/16/2015 Elsevier Interactive Patient Education  2017 [place] Prevention in the Home Falls can cause injuries. They can happen to people of all ages. There are many things you can do to make your home safe and to help prevent falls. What can I do on the outside of my home? Regularly fix the edges of walkways and driveways and fix any cracks. Remove anything that might make you trip as you walk through a door, such as a raised step or threshold. Trim any bushes or trees on the path to your home. Use bright outdoor lighting. Clear any walking paths of anything that might make someone trip, such as rocks or tools. Regularly check to see if handrails are loose or broken. Make sure that both sides of any steps have handrails. Any raised decks and porches should have guardrails on the edges. Have any leaves, snow, or ice cleared regularly. Use sand or salt on walking paths during winter. Clean up any spills in your garage right away. This includes oil or grease spills. What can I do in the bathroom? Use night lights. Install grab bars by the toilet and in the tub and shower. Do not use towel bars as grab bars. Use non-skid mats or decals in the tub or shower. If you need to sit down in the shower, use a plastic, non-slip stool. Keep the floor dry. Clean up any water that spills on the floor as soon as it happens. Remove soap buildup in the tub or shower regularly. Attach bath mats securely with double-sided non-slip rug tape. Do not have throw rugs and other things on the floor that can make you trip. What can I do in the bedroom? Use night lights. Make sure that you have a light by your bed that is easy to reach. Do not use any sheets or  blankets that are too big for your bed. They should not hang down onto the floor. Have a firm chair that has side arms. You can use this for support while you get dressed. Do not have throw rugs and other things on the floor that can make you trip. What can I do in the kitchen? Clean up any spills right away. Avoid walking on wet floors. Keep items that you use a lot in easy-to-reach places. If you need to reach something above you, use a strong step stool that has a grab bar. Keep electrical cords out of the way. Do not use floor polish or wax that makes floors slippery. If you must use wax, use non-skid floor wax. Do not have throw rugs and other things on the floor that can make you trip. What can I do with my stairs? Do not leave any items on the stairs. Make sure that there are handrails on both sides of the stairs and use them. Fix handrails that are broken or loose. Make sure that handrails are as long as the stairways. Check any carpeting to make sure that it is firmly attached to the stairs. Fix any carpet that is loose or worn. Avoid having throw rugs at the top or bottom of the stairs. If you do have throw rugs, attach them to the floor with carpet tape. Make sure  that you have a light switch at the top of the stairs and the bottom of the stairs. If you do not have them, ask someone to add them for you. What else can I do to help prevent falls? Wear shoes that: Do not have high heels. Have rubber bottoms. Are comfortable and fit you well. Are closed at the toe. Do not wear sandals. If you use a stepladder: Make sure that it is fully opened. Do not climb a closed stepladder. Make sure that both sides of the stepladder are locked into place. Ask someone to hold it for you, if possible. Clearly mark and make sure that you can see: Any grab bars or handrails. First and last steps. Where the edge of each step is. Use tools that help you move around (mobility aids) if they are  needed. These include: Canes. Walkers. Scooters. Crutches. Turn on the lights when you go into a dark area. Replace any light bulbs as soon as they burn out. Set up your furniture so you have a clear path. Avoid moving your furniture around. If any of your floors are uneven, fix them. If there are any pets around you, be aware of where they are. Review your medicines with your doctor. Some medicines can make you feel dizzy. This can increase your chance of falling. Ask your doctor what other things that you can do to help prevent falls. This information is not intended to replace advice given to you by your health care provider. Make sure you discuss any questions you have with your health care provider. Document Released: 01/11/2009 Document Revised: 08/23/2015 Document Reviewed: 04/21/2014 Elsevier Interactive Patient Education  2017 [employer].

## 2020-10-09 ENCOUNTER — Telehealth: Payer: Self-pay | Admitting: Family Medicine

## 2020-10-09 NOTE — Telephone Encounter (Signed)
Call in a 4 mg Medrol dose pack, 21 tablets

## 2020-10-09 NOTE — Telephone Encounter (Signed)
Please advise 

## 2020-10-09 NOTE — Telephone Encounter (Signed)
Pt is calling in stating that he need to have something for poison ivy or oak.  Pharm:  [facility] in [place], [place]  Pt declined to make an appointment for him to get the prednisone that he is requesting.

## 2020-10-10 ENCOUNTER — Other Ambulatory Visit: Payer: Self-pay

## 2020-10-10 MED ORDER — METHYLPREDNISOLONE 4 MG PO TBPK: ORAL_TABLET | ORAL | 0 refills | Status: DC

## 2020-10-10 NOTE — Telephone Encounter (Signed)
I just sent in the Medrol dose pack to his pharmacy

## 2020-10-10 NOTE — Telephone Encounter (Signed)
How many tabs to be taken per day?

## 2020-10-10 NOTE — Addendum Note (Signed)
Addended by: Alysia Penna A on: 10/10/2020 04:55 PM   Modules accepted: Orders

## 2020-10-11 NOTE — Telephone Encounter (Addendum)
Lvm informing patient prescription has been sent to pharmacy.

## 2020-10-15 ENCOUNTER — Encounter: Payer: Self-pay | Admitting: Family Medicine

## 2020-10-15 ENCOUNTER — Telehealth: Payer: Medicare HMO | Admitting: Family Medicine

## 2020-10-19 ENCOUNTER — Other Ambulatory Visit: Payer: Self-pay

## 2020-10-19 ENCOUNTER — Ambulatory Visit (INDEPENDENT_AMBULATORY_CARE_PROVIDER_SITE_OTHER): Payer: Medicare HMO | Admitting: Family Medicine

## 2020-10-19 ENCOUNTER — Encounter: Payer: Self-pay | Admitting: Family Medicine

## 2020-10-19 VITALS — BP 138/72 | HR 79 | Temp 98.4°F | Ht 66.0 in | Wt 202.8 lb

## 2020-10-19 DIAGNOSIS — Z0000 Encounter for general adult medical examination without abnormal findings: Secondary | ICD-10-CM

## 2020-10-19 LAB — CBC WITH DIFFERENTIAL/PLATELET
Basophils Absolute: 0 10*3/uL (ref 0.0–0.1)
Basophils Relative: 0.4 % (ref 0.0–3.0)
Eosinophils Absolute: 0.1 10*3/uL (ref 0.0–0.7)
Eosinophils Relative: 0.9 % (ref 0.0–5.0)
HCT: 48 % (ref 39.0–52.0)
Hemoglobin: 16.2 g/dL (ref 13.0–17.0)
Lymphocytes Relative: 19 % (ref 12.0–46.0)
Lymphs Abs: 1.2 10*3/uL (ref 0.7–4.0)
MCHC: 33.8 g/dL (ref 30.0–36.0)
MCV: 88.2 fl (ref 78.0–100.0)
Monocytes Absolute: 0.4 10*3/uL (ref 0.1–1.0)
Monocytes Relative: 6.5 % (ref 3.0–12.0)
Neutro Abs: 4.4 10*3/uL (ref 1.4–7.7)
Neutrophils Relative %: 73.2 % (ref 43.0–77.0)
Platelets: 219 10*3/uL (ref 150.0–400.0)
RBC: 5.44 Mil/uL (ref 4.22–5.81)
RDW: 13.6 % (ref 11.5–15.5)
WBC: 6.1 10*3/uL (ref 4.0–10.5)

## 2020-10-19 LAB — BASIC METABOLIC PANEL
BUN: 19 mg/dL (ref 6–23)
CO2: 28 mEq/L (ref 19–32)
Calcium: 9 mg/dL (ref 8.4–10.5)
Chloride: 102 mEq/L (ref 96–112)
Creatinine, Ser: 0.8 mg/dL (ref 0.40–1.50)
GFR: 91.79 mL/min (ref >60.00)
Glucose, Bld: 98 mg/dL (ref 70–99)
Potassium: 3.9 mEq/L (ref 3.5–5.1)
Sodium: 138 mEq/L (ref 135–145)

## 2020-10-19 LAB — HEPATIC FUNCTION PANEL
ALT: 15 U/L (ref 0–53)
AST: 16 U/L (ref 0–37)
Albumin: 4.7 g/dL (ref 3.5–5.2)
Alkaline Phosphatase: 70 U/L (ref 39–117)
Bilirubin, Direct: 0.1 mg/dL (ref 0.0–0.3)
Total Bilirubin: 0.9 mg/dL (ref 0.2–1.2)
Total Protein: 7.1 g/dL (ref 6.0–8.3)

## 2020-10-19 LAB — T3, FREE: T3, Free: 3.8 pg/mL (ref 2.3–4.2)

## 2020-10-19 LAB — HEMOGLOBIN A1C: Hgb A1c MFr Bld: 5.6 % (ref 4.6–6.5)

## 2020-10-19 LAB — T4, FREE: Free T4: 0.97 ng/dL (ref 0.60–1.60)

## 2020-10-19 LAB — LIPID PANEL
Cholesterol: 224 mg/dL — ABNORMAL HIGH (ref 0–200)
HDL: 57.8 mg/dL (ref >39.00)
LDL Cholesterol: 133 mg/dL — ABNORMAL HIGH (ref 0–99)
NonHDL: 166.24
Total CHOL/HDL Ratio: 4
Triglycerides: 164 mg/dL — ABNORMAL HIGH (ref 0.0–149.0)
VLDL: 32.8 mg/dL (ref 0.0–40.0)

## 2020-10-19 LAB — PSA: PSA: 2.14 ng/mL (ref 0.10–4.00)

## 2020-10-19 LAB — TSH: TSH: 2.2 u[IU]/mL (ref 0.35–5.50)

## 2020-10-19 MED ORDER — FEXOFENADINE-PSEUDOEPHED ER 180-240 MG PO TB24: 1.0000 | ORAL_TABLET | Freq: Every day | ORAL | 3 refills | Status: DC

## 2020-10-19 NOTE — Progress Notes (Signed)
   Subjective:    Patient ID: Alejandro Hill, male    DOB: [birth year], 65 y.o.   MRN: [id number]  HPI Here for a well exam. He feels fine.    Review of Systems  Constitutional: Negative.   HENT: Negative.    Eyes: Negative.   Respiratory: Negative.    Cardiovascular: Negative.   Gastrointestinal: Negative.   Genitourinary: Negative.   Musculoskeletal: Negative.   Skin: Negative.   Neurological: Negative.   Psychiatric/Behavioral: Negative.        Objective:   Physical Exam Constitutional:      General: He is not in acute distress.    Appearance: Normal appearance. He is well-developed. He is not diaphoretic.  HENT:     Head: Normocephalic and atraumatic.     Right Ear: External ear normal.     Left Ear: External ear normal.     Nose: Nose normal.     Mouth/Throat:     Pharynx: No oropharyngeal exudate.  Eyes:     General: No scleral icterus.       Right eye: No discharge.        Left eye: No discharge.     Conjunctiva/sclera: Conjunctivae normal.     Pupils: Pupils are equal, round, and reactive to light.  Neck:     Thyroid: No thyromegaly.     Vascular: No JVD.     Trachea: No tracheal deviation.  Cardiovascular:     Rate and Rhythm: Normal rate and regular rhythm.     Heart sounds: Normal heart sounds. No murmur heard.   No friction rub. No gallop.  Pulmonary:     Effort: Pulmonary effort is normal. No respiratory distress.     Breath sounds: Normal breath sounds. No wheezing or rales.  Chest:     Chest wall: No tenderness.  Abdominal:     General: Bowel sounds are normal. There is no distension.     Palpations: Abdomen is soft. There is no mass.     Tenderness: There is no abdominal tenderness. There is no guarding or rebound.  Genitourinary:    Penis: Normal. No tenderness.      Testes: Normal.     Prostate: Normal.     Rectum: Normal. Guaiac result negative.  Musculoskeletal:        General: No tenderness. Normal range of motion.     Cervical back:  Neck supple.  Lymphadenopathy:     Cervical: No cervical adenopathy.  Skin:    General: Skin is warm and dry.     Coloration: Skin is not pale.     Findings: No erythema or rash.  Neurological:     Mental Status: He is alert and oriented to person, place, and time.     Cranial Nerves: No cranial nerve deficit.     Motor: No abnormal muscle tone.     Coordination: Coordination normal.     Deep Tendon Reflexes: Reflexes are normal and symmetric. Reflexes normal.  Psychiatric:        Behavior: Behavior normal.        Thought Content: Thought content normal.        Judgment: Judgment normal.          Assessment & Plan:  Well exam. We discussed diet and exercise. Get fasting labs.  Alysia Penna, MD

## 2020-12-26 ENCOUNTER — Other Ambulatory Visit: Payer: Self-pay | Admitting: Family Medicine

## 2021-01-02 ENCOUNTER — Other Ambulatory Visit: Payer: Self-pay | Admitting: Family Medicine

## 2021-08-08 ENCOUNTER — Other Ambulatory Visit: Payer: Self-pay | Admitting: Family Medicine

## 2021-09-11 ENCOUNTER — Encounter: Payer: Self-pay | Admitting: Family Medicine

## 2021-09-11 ENCOUNTER — Ambulatory Visit (INDEPENDENT_AMBULATORY_CARE_PROVIDER_SITE_OTHER): Payer: Medicare HMO | Admitting: Family Medicine

## 2021-09-11 VITALS — BP 118/80 | HR 63 | Temp 98.6°F | Wt 214.0 lb

## 2021-09-11 DIAGNOSIS — F419 Anxiety disorder, unspecified: Secondary | ICD-10-CM | POA: Diagnosis not present

## 2021-09-11 NOTE — Progress Notes (Signed)
   Subjective:    Patient ID: Alejandro Hill, male    DOB: 1955/11/13, 66 y.o.   MRN: [id number]  HPI Here asking for advice about his anxiety. He says he is the only male living in a household of females (his wife, his daughter, and his granddaughters). He says his wife has bipolar disorder, and she make shis life quite stressful. He is skeptical about prescription medications for anxiety and depression, but he takes a number of OTC herbs and supplements to try to feel better (including "thyroid boosters"  and pills to "regulate my adrenal glands". He is not sure what else he can do.    Review of Systems  Constitutional: Negative.   Respiratory: Negative.    Cardiovascular: Negative.   Psychiatric/Behavioral:  Positive for decreased concentration, dysphoric mood and sleep disturbance. The patient is nervous/anxious.        Objective:   Physical Exam Constitutional:      Appearance: Normal appearance.  Cardiovascular:     Rate and Rhythm: Normal rate and regular rhythm.     Pulses: Normal pulses.     Heart sounds: Normal heart sounds.  Pulmonary:     Effort: Pulmonary effort is normal.     Breath sounds: Normal breath sounds.  Neurological:     Mental Status: He is alert.  Psychiatric:        Behavior: Behavior normal.        Thought Content: Thought content normal.     Comments: He is fairly anxious            Assessment & Plan:  He has situational anxiety, and I suggested he start seeing a psychotherapist. I advises hi to research therapists near his home in [place], [place], and he said he will do so. Recheck as needed. Alysia Penna, MD

## 2021-09-13 ENCOUNTER — Telehealth: Payer: Self-pay | Admitting: Family Medicine

## 2021-09-13 NOTE — Telephone Encounter (Signed)
Pt requesting some sort of statement clarifying that he is of sound state of mind. Met with provider  recently to discuss this. Patient states you can call for clarification.

## 2021-09-16 NOTE — Telephone Encounter (Signed)
Spoke with patient.   He is currently looking for a psychotherapist, but he is requesting a letter stating that he is of sound mind.  Patient is requesting this letter just to have, "in case something happens" as stated per patient.  Please advise.

## 2021-09-16 NOTE — Telephone Encounter (Signed)
Called patient to attempt to seek more clarity about message.   Will retry again at later time.

## 2021-09-17 ENCOUNTER — Ambulatory Visit (INDEPENDENT_AMBULATORY_CARE_PROVIDER_SITE_OTHER): Payer: Medicare HMO | Admitting: Family Medicine

## 2021-09-17 ENCOUNTER — Encounter: Payer: Self-pay | Admitting: Family Medicine

## 2021-09-17 VITALS — BP 126/82 | HR 65 | Temp 98.7°F | Wt 219.0 lb

## 2021-09-17 DIAGNOSIS — E119 Type 2 diabetes mellitus without complications: Secondary | ICD-10-CM | POA: Diagnosis not present

## 2021-09-17 DIAGNOSIS — E782 Mixed hyperlipidemia: Secondary | ICD-10-CM | POA: Diagnosis not present

## 2021-09-17 DIAGNOSIS — E039 Hypothyroidism, unspecified: Secondary | ICD-10-CM

## 2021-09-17 DIAGNOSIS — I10 Essential (primary) hypertension: Secondary | ICD-10-CM | POA: Diagnosis not present

## 2021-09-17 NOTE — Telephone Encounter (Signed)
This is a very odd request. I would need to have him meet with me to discuss this further

## 2021-09-17 NOTE — Telephone Encounter (Signed)
Lvm for patient to call office and schedule office visit appointment to discuss matter as per Dr. Sarajane Jews request.

## 2021-09-17 NOTE — Progress Notes (Signed)
   Subjective:    Patient ID: Alejandro Hill, male    DOB: Aug 18, 1955, 66 y.o.   MRN: [id number]  HPI Here asking for a letter to state that in my opinion he is of sound mind and body. He feel that his wife and daughter are conspiring to void his will so they can control who gets what in the case of his passing. The letter needs to state that he is abel to make his own decisions.    Review of Systems  Constitutional: Negative.   Respiratory: Negative.    Cardiovascular: Negative.   Neurological: Negative.   Psychiatric/Behavioral: Negative.         Objective:   Physical Exam Constitutional:      Appearance: Normal appearance.  Cardiovascular:     Rate and Rhythm: Normal rate and regular rhythm.     Pulses: Normal pulses.     Heart sounds: Normal heart sounds.  Pulmonary:     Effort: Pulmonary effort is normal.     Breath sounds: Normal breath sounds.  Neurological:     General: No focal deficit present.     Mental Status: He is alert and oriented to person, place, and time. Mental status is at baseline.  Psychiatric:        Mood and Affect: Mood normal.        Behavior: Behavior normal.        Thought Content: Thought content normal.        Judgment: Judgment normal.           Assessment & Plan:  I do feel that all the statements above are true, and I composed a letter to document this. He will keep the original in case he needs it in the future.  Alysia Penna, MD

## 2021-09-18 ENCOUNTER — Telehealth: Payer: Self-pay | Admitting: Family Medicine

## 2021-09-18 DIAGNOSIS — F411 Generalized anxiety disorder: Secondary | ICD-10-CM

## 2021-09-18 NOTE — Telephone Encounter (Signed)
I did the referral to Dr. Casimiro Needle

## 2021-09-18 NOTE — Telephone Encounter (Signed)
Patient requesting a referal for therapy to Norma Fredrickson fax [phone]   [facility]. Patient states he wants them to communicate via text message and/or email.

## 2021-09-18 NOTE — Telephone Encounter (Signed)
Spoke with pt aware that referral was placed

## 2021-10-08 ENCOUNTER — Telehealth: Payer: Self-pay | Admitting: Family Medicine

## 2021-10-08 NOTE — Telephone Encounter (Signed)
Left message for patient to call back and schedule Medicare Annual Wellness Visit (AWV) either virtually or in office. Left  my Alejandro Hill number [phone]   Last AWV ;10/08/20 please schedule at anytime with [facility] Nurse Health Advisor 1 or 2

## 2021-10-10 ENCOUNTER — Ambulatory Visit (INDEPENDENT_AMBULATORY_CARE_PROVIDER_SITE_OTHER): Payer: Medicare HMO

## 2021-10-10 VITALS — BP 122/74 | Ht 67.0 in | Wt 218.0 lb

## 2021-10-10 DIAGNOSIS — Z0000 Encounter for general adult medical examination without abnormal findings: Secondary | ICD-10-CM | POA: Diagnosis not present

## 2021-10-10 NOTE — Progress Notes (Addendum)
Subjective:   Alejandro Hill is a 66 y.o. male who presents for Medicare Annual/Subsequent preventive examination.  Review of Systems    Virtual Visit via Telephone Note  I connected with  Alejandro Hill on 10/10/21 at  1:30 PM EDT by telephone and verified that I am speaking with the correct person using two identifiers.  Location: Patient: Home Provider: Office Persons participating in the virtual visit: patient/Nurse Health Advisor   I discussed the limitations, risks, security and privacy concerns of performing an evaluation and management service by telephone and the availability of in person appointments. The patient expressed understanding and agreed to proceed.  Interactive audio and video telecommunications were attempted between this nurse and patient, however failed, due to patient having technical difficulties OR patient did not have access to video capability.  We continued and completed visit with audio only.  Some vital signs may be absent or patient reported.   Criselda Peaches, LPN  Cardiac Risk Factors include: advanced age (>74mn, >>45women);hypertension;male gender     Objective:    Today's Vitals   10/10/21 1350  BP: 122/74  Weight: 218 lb (98.9 kg)  Height: '5\' 7"'$  (1.702 m)   Body mass index is 34.14 kg/m.     10/10/2021    1:57 PM 10/08/2020   10:44 AM 03/26/2020    9:57 AM 02/21/2014    8:21 AM 02/13/2014    8:42 AM 12/07/2013    5:20 PM  Advanced Directives  Does Patient Have a Medical Advance Directive? Yes Yes Yes No Yes No  Type of AParamedicof [place]illedaLiving will [place]iday [place] will   [place]en Glades[place] will   Does patient want to make changes to medical advance directive? No - Patient declined  No - Guardian declined  No - Patient declined   Copy of [place] Chart? No - copy requested No - copy requested   No - copy requested   Would patient like information  on creating a medical advance directive?   No - Patient declined Yes - Educational materials given      Current Medications (verified) Outpatient Encounter Medications as of 10/10/2021  Medication Sig   ascorbic acid (VITAMIN C) 1000 MG tablet Take 1,000 mg by mouth daily.   B Complex Vitamins (VITAMIN B COMPLEX PO) Take 1 tablet by mouth daily.   Cholecalciferol (VITAMIN D-3) 125 MCG (5000 UT) TABS Take 1 tablet by mouth daily.   Cinnamon 500 MG TABS Take 2 tablets by mouth daily.   fexofenadine-pseudoephedrine (ALLEGRA-D ALLERGY & CONGESTION) 180-240 MG 24 hr tablet Take 1 tablet by mouth daily.   fluticasone (FLONASE) 50 MCG/ACT nasal spray USE 2 SPRAYS INTO EACH NOSTRIL EVERY DAY   Garlic 6027MG CAPS Take 2 capsules by mouth daily.    ivermectin (STROMECTOL) 3 MG TABS tablet Take 12 mg by mouth every Sunday.   KRILL OIL PO Take 1 capsule by mouth daily.   LYSINE PO Take 1 tablet by mouth daily.    methylPREDNISolone (MEDROL DOSEPAK) 4 MG TBPK tablet As directed   montelukast (SINGULAIR) 10 MG tablet TAKE 1 TABLET AT BEDTIME   Multiple Vitamin (MULTIVITAMIN) tablet Take 1 tablet by mouth daily.   NON FORMULARY 5 mcg daily. Sea Kelp   OVER THE COUNTER MEDICATION Take 2 tablets by mouth daily. Triple strength curimin supplement   Red Yeast Rice Extract (RED YEAST RICE PO) Take 1 capsule by mouth  2 (two) times daily.    verapamil (CALAN-SR) 240 MG CR tablet TAKE 1 TABLET EVERY DAY   No facility-administered encounter medications on file as of 10/10/2021.    Allergies (verified) Cefuroxime axetil, Codeine, Erythromycin, Morphine and related, Ppd [tuberculin purified protein derivative], Sulfa antibiotics, Norco [hydrocodone-acetaminophen], and Penicillins   History: Past Medical History:  Diagnosis Date   Anemia    as a child   Asthma    exertional, seasonal (spring)   Diabetes mellitus    type II diabetes     Heart murmur    as a child   Hemorrhoids    Hyperlipidemia     Hypertension    Hypogonadism male    sees Dr. Diona Fanti   Kidney stone    Migraine    cluster, had [facility]   Pneumonia 2012   Seasonal allergies    Sleep apnea    , uses cpap   Thyroid disease    hypothyroidism - not taking medications except for [facility]   Umbilical hernia    Past Surgical History:  Procedure Laterality Date   CATARACT EXTRACTION Bilateral 01/30/2011   with lens implant   COLONOSCOPY  01/18/2019   Cologuard was negative   EXTRACORPOREAL SHOCK WAVE LITHOTRIPSY Left 03/26/2020   Procedure: EXTRACORPOREAL SHOCK WAVE LITHOTRIPSY (ESWL);  Surgeon: Ardis Hughs, MD;  [facility];  Service: Urology;  Laterality: Left;   EYE SURGERY Bilateral    RK in his 36's, lasik when he was [place] N/A 02/20/2014   Procedure: HEMORRHOIDECTOMY;  Surgeon: Autumn Messing III, MD;  Location: [facility];  Service: General;  Laterality: N/A;   HERNIA REPAIR  08/03/2007   left inguinal herniorrhaphy. Dr Marlou Starks   INSERTION OF MESH N/A 02/20/2014   Procedure: INSERTION OF MESH;  Surgeon: Autumn Messing III, MD;  Location: [facility];  Service: General;  Laterality: N/A;   TONSILLECTOMY     UMBILICAL HERNIA REPAIR  02/20/2014   & EXTERNAL HEMORRHOROIDS     DR TOTH   UMBILICAL HERNIA REPAIR N/A 02/20/2014   Procedure: HERNIA REPAIR UMBILICAL ADULT;  Surgeon: Autumn Messing III, MD;  Location: [facility];  Service: General;  Laterality: N/A;   UVULOPALATOPHARYNGOPLASTY     per Dr Ernesto Rutherford   VASECTOMY     WISDOM TOOTH EXTRACTION     Family History  Problem Relation Age of Onset   Heart disease Mother    Liver disease Father    Lung cancer Father    Heart disease Other    Hyperlipidemia Other    Stroke Other    Social History   Socioeconomic History   Marital status: Married    Spouse name: Not on file   Number of children: Not on file   Years of education: Not on file   Highest education level: Not on file  Occupational History    Occupation: self employed  Tobacco Use   Smoking status: Never   Smokeless tobacco: Never  Substance and Sexual Activity   Alcohol use: Yes    Alcohol/week: 0.0 standard drinks of alcohol    Comment: 1-2 drink per week   Drug use: No   Sexual activity: Not on file  Other Topics Concern   Not on file  Social History Narrative   Regular Exercise:  Yes   Religion affecting care (Jehovah"s Witness) so will not accept blood products.   Social Determinants of Health   Financial Resource Strain: Low Risk  (  10/10/2021)   Overall Financial Resource Strain (CARDIA)    Difficulty of Paying Living Expenses: Not hard at all  Food Insecurity: No Food Insecurity (10/10/2021)   Hunger Vital Sign    Worried About Running Out of Food in the Last Year: Never true    Ran Out of Food in the Last Year: Never true  Transportation Needs: No Transportation Needs (10/10/2021)   PRAPARE - Hydrologist (Medical): No    Lack of Transportation (Non-Medical): No  Physical Activity: Insufficiently Active (10/10/2021)   Exercise Vital Sign    Days of Exercise per Week: 2 days    Minutes of Exercise per Session: 30 min  Stress: Stress Concern Present (10/10/2021)   [place]    Feeling of Stress : To some extent  Social Connections: Unknown (10/10/2021)   Social Connection and Isolation Panel [NHANES]    Frequency of Communication with Friends and Family: More than three times a week    Frequency of Social Gatherings with Friends and Family: Once a week    Attends Religious Services: Patient refused    Marine scientist or Organizations: Yes    Attends Music therapist: Patient refused    Marital Status: Married     Clinical Intake:  Pre-visit preparation completed: Yes  Pain : No/denies pain     BMI - recorded: 35.01 Nutritional Status: BMI > 30  Obese Nutritional Risks: None Diabetes:  No  How often do you need to have someone help you when you read instructions, pamphlets, or other written materials from your doctor or pharmacy?: 1 - Never  Diabetic?  No  Interpreter Needed?: No  Information entered by :: Rolene Arbour LPN   Activities of Daily Living    10/10/2021    1:56 PM 10/09/2021    6:41 PM  In your present state of health, do you have any difficulty performing the following activities:  Hearing? 0 0  Vision? 0 0  Difficulty concentrating or making decisions? 0 0  Walking or climbing stairs? 0 0  Dressing or bathing? 0 0  Doing errands, shopping? 0 0  Preparing Food and eating ? N N  Using the Toilet? N N  In the past six months, have you accidently leaked urine? N   Do you have problems with loss of bowel control? N N  Managing your Medications? N N  Managing your Finances? N N  Housekeeping or managing your Housekeeping? N N    Patient Care Team: Laurey Morale, MD as PCP - General Shaune Leeks, Jens Som, MD (Inactive) as Referring Physician (Allergy)  Indicate any recent Medical Services you may have received from other than [facility] providers in the past year (date may be approximate).     Assessment:   This is a routine wellness examination for [place].  Hearing/Vision screen Hearing Screening - Comments:: No hearing difficulty Vision Screening - Comments:: Wears reading glasses  Dietary issues and exercise activities discussed: Exercise limited by: None identified   Goals Addressed               This Visit's Progress     No cureent goals (pt-stated)         Depression Screen    10/10/2021    1:54 PM 09/11/2021    8:32 AM 10/08/2020   10:48 AM 10/08/2020   10:41 AM  PHQ 2/9 Scores  PHQ - 2 Score 0 0  0 0  PHQ- 9 Score 0 2      Fall Risk    10/10/2021    1:56 PM 10/09/2021    6:41 PM 09/11/2021    8:32 AM 10/08/2020   10:48 AM  Fall Risk   Falls in the past year? 0 0 0 0  Number falls in past yr: 0 0 0 0  Injury with Fall? 0 0  0 0  Risk for fall due to : No Fall Risks  No Fall Risks   Follow up   Falls evaluation completed Falls evaluation completed    FALL RISK PREVENTION PERTAINING TO THE HOME:  Any stairs in or around the home? Yes  If so, are there any without handrails? No  Home free of loose throw rugs in walkways, pet beds, electrical cords, etc? Yes  Adequate lighting in your home to reduce risk of falls? Yes   ASSISTIVE DEVICES UTILIZED TO PREVENT FALLS:  Life alert? No  Use of a cane, walker or w/c? No  Grab bars in the bathroom? Yes  Shower chair or bench in shower? No  Elevated toilet seat or a handicapped toilet? No   TIMED UP AND GO:  Was the test performed? No . Audio Visit  Cognitive Function:        10/10/2021    1:58 PM  6CIT Screen  What Year? 0 points  What month? 0 points  What time? 0 points  Count back from 20 0 points  Months in reverse 0 points  Repeat phrase 0 points  Total Score 0 points    Immunizations  There is no immunization history on file for this patient.  TDAP status: Due, Education has been provided regarding the importance of this vaccine. Advised may receive this vaccine at local pharmacy or Health Dept. Aware to provide a copy of the vaccination record if obtained from local pharmacy or Health Dept. Verbalized acceptance and understanding.  Flu Vaccine status: Declined, Education has been provided regarding the importance of this vaccine but patient still declined. Advised may receive this vaccine at local pharmacy or Health Dept. Aware to provide a copy of the vaccination record if obtained from local pharmacy or Health Dept. Verbalized acceptance and understanding.  Pneumococcal vaccine status: Declined,  Education has been provided regarding the importance of this vaccine but patient still declined. Advised may receive this vaccine at local pharmacy or Health Dept. Aware to provide a copy of the vaccination record if obtained from local pharmacy or  Health Dept. Verbalized acceptance and understanding.   Covid-19 vaccine status: Declined, Education has been provided regarding the importance of this vaccine but patient still declined. Advised may receive this vaccine at local pharmacy or Health Dept.or vaccine clinic. Aware to provide a copy of the vaccination record if obtained from local pharmacy or Health Dept. Verbalized acceptance and understanding.  Qualifies for Shingles Vaccine? Yes   Zostavax completed No   Shingrix Completed?: No.    Education has been provided regarding the importance of this vaccine. Patient has been advised to call insurance company to determine out of pocket expense if they have not yet received this vaccine. Advised may also receive vaccine at local pharmacy or Health Dept. Verbalized acceptance and understanding.  Screening Tests Health Maintenance  Topic Date Due   FOOT EXAM  Never done   OPHTHALMOLOGY EXAM  Never done   HEMOGLOBIN A1C  04/21/2021   COVID-19 Vaccine (1) 10/26/2021 (Originally 02/16/1956)   URINE MICROALBUMIN  11/10/2021 (  Originally 01/26/2015)   Zoster Vaccines- Shingrix (1 of 2) 01/10/2022 (Originally 08/17/2003)   Pneumonia Vaccine 76+ Years old (1 - PCV) 10/11/2022 (Originally 08/17/2018)   COLONOSCOPY (Pts 45-88yr Insurance coverage will need to be confirmed)  10/11/2022 (Originally 02/20/2019)   TETANUS/TDAP  10/11/2022 (Originally 08/16/1972)   Hepatitis C Screening  10/11/2022 (Originally 08/17/1971)   HPV VACCINES  Aged Out    Health Maintenance  Health Maintenance Due  Topic Date Due   FOOT EXAM  Never done   OPHTHALMOLOGY EXAM  Never done   HEMOGLOBIN A1C  04/21/2021    Colorectal cancer screening: Referral to GI placed Patient deferred. Pt aware the office will call re: appt.  Lung Cancer Screening: (Low Dose CT Chest recommended if Age 68-80years, 30 pack-year currently smoking OR have quit w/in 15years.) does not qualify.     Additional Screening:  Hepatitis C  Screening: does qualify; Completed Patient deferred  Vision Screening: Recommended annual ophthalmology exams for early detection of glaucoma and other disorders of the eye. Is the patient up to date with their annual eye exam?  Yes  Who is the provider or what is the name of the office in which the patient attends annual eye exams? Patient Unsure If pt is not established with a provider, would they like to be referred to a provider to establish care? No .   Dental Screening: Recommended annual dental exams for proper oral hygiene  Community Resource Referral / Chronic Care Management:   CRR required this visit?  No   CCM required this visit?  No      Plan:     I have personally reviewed and noted the following in the patient's chart:   Medical and social history Use of alcohol, tobacco or illicit drugs  Current medications and supplements including opioid prescriptions. Patient is currently taking opioid prescriptions. Information provided to patient regarding non-opioid alternatives. Patient advised to discuss non-opioid treatment plan with their provider. Functional ability and status Nutritional status Physical activity Advanced directives List of other physicians Hospitalizations, surgeries, and ER visits in previous 12 months Vitals Screenings to include cognitive, depression, and falls Referrals and appointments  In addition, I have reviewed and discussed with patient certain preventive protocols, quality metrics, and best practice recommendations. A written personalized care plan for preventive services as well as general preventive health recommendations were provided to patient.     BCriselda Peaches LPN   78/34/1964  Nurse Notes: Patient due labs Hep-C, Microalbumin and Hemoglobin A1C

## 2021-10-10 NOTE — Patient Instructions (Addendum)
[name] , Thank you for taking time to come for your Medicare Wellness Visit. I appreciate your ongoing commitment to your health goals. Please review the following plan we discussed and let me know if I can assist you in the future.   These are the goals we discussed:  Goals       No cureent goals (pt-stated)      Weight Loss Achieved      Evidence-based guidance:  Review medication that may contribute to weight gain, such as corticosteroid, beta-blocker, tricyclic antidepressant, oral antihyperglycemic; advocate for changes when appropriate.  Perform or refer to registered dietitian to perform comprehensive nutrition assessment that includes disordered-eating behaviors, such as binge-eating, emotional or compulsive eating, grazing.  Counsel patient regarding health risks of obesity and that weight loss goal of 5 to 10 percent of initial weight will improve risk.  Recommend initial weight loss goal of 3 to 5 percent of bodyweight; increase weight-loss goals based on patient success as achieving greater weight loss continues to reduce risk.  Propose a calorie-reduced diet based on the patient's preferences and health status.  Provide ongoing emotional support or cognitive behavioral therapy and dietitian services (individual, group, virtual) over at least 6 months with a minimum of 14 encounters to best facilitate weight loss.  Provide monthly follow-up for 12 months when weight loss goal is met to assist with maintenance of weight loss.  Encourage increased physical activity or exercise based on individual age, risk, and ability up to 200 to 300 minutes per week that includes aerobic and resistance training.  Encourage reduction in sedentary behaviors by replacing them with nonexercise yet active leisure pursuits.  Identify physical barriers, such as change in posture, balance, gait patterns, joint pain, and environmental barriers to activity.  Consider referral to rehabilitation therapy,  especially when mobility or function is impaired due to osteoarthritis and obesity.  Consider referral to weight-loss program that has published evidence of safety and efficacy if on-site intensive intervention is unavailable or patient preference.  Prepare patient for use of pharmacologic therapy as an adjunct to lifestyle changes based on body mass index, patient agreement and presence of risk factors or comorbidities.  Evaluate efficacy of pharmacologic therapy (weight loss) and tolerance to medication periodically.  Engage in shared decision-making regarding referral to bariatric surgeon for consultation and evaluation when weight-loss goal has not been accomplished by behavioral therapy with or without pharmacologic therapy.   Notes:         This is a list of the screening recommended for you and due dates:  Health Maintenance  Topic Date Due   Complete foot exam   Never done   Eye exam for diabetics  Never done   Hemoglobin A1C  04/21/2021   COVID-19 Vaccine (1) 10/26/2021*   Urine Protein Check  11/10/2021*   Zoster (Shingles) Vaccine (1 of 2) 01/10/2022*   Pneumonia Vaccine (1 - PCV) 10/11/2022*   Colon Cancer Screening  10/11/2022*   Tetanus Vaccine  10/11/2022*   Hepatitis C Screening: USPSTF Recommendation to screen - Ages 18-79 yo.  10/11/2022*   HPV Vaccine  Aged Out  *Topic was postponed. The date shown is not the original due date.   Opioid Pain Medicine Management Opioids are powerful medicines that are used to treat moderate to severe pain. When used for short periods of time, they can help you to: Sleep better. Do better in physical or occupational therapy. Feel better in the first few days after an injury. Recover from  surgery. Opioids should be taken with the supervision of a trained health care provider. They should be taken for the shortest period of time possible. This is because opioids can be addictive, and the longer you take opioids, the greater your risk of  addiction. This addiction can also be called opioid use disorder. What are the risks? Using opioid pain medicines for longer than 3 days increases your risk of side effects. Side effects include: Constipation. Nausea and vomiting. Breathing difficulties (respiratory depression). Drowsiness. Confusion. Opioid use disorder. Itching. Taking opioid pain medicine for a long period of time can affect your ability to do daily tasks. It also puts you at risk for: Motor vehicle crashes. Depression. Suicide. Heart attack. Overdose, which can be life-threatening. What is a pain treatment plan? A pain treatment plan is an agreement between you and your health care provider. Pain is unique to each person, and treatments vary depending on your condition. To manage your pain, you and your health care provider need to work together. To help you do this: Discuss the goals of your treatment, including how much pain you might expect to have and how you will manage the pain. Review the risks and benefits of taking opioid medicines. Remember that a good treatment plan uses more than one approach and minimizes the chance of side effects. Be honest about the amount of medicines you take and about any drug or alcohol use. Get pain medicine prescriptions from only one health care provider. Pain can be managed with many types of alternative treatments. Ask your health care provider to refer you to one or more specialists who can help you manage pain through: Physical or occupational therapy. Counseling (cognitive behavioral therapy). Good nutrition. Biofeedback. Massage. Meditation. Non-opioid medicine. Following a gentle exercise program. How to use opioid pain medicine Taking medicine Take your pain medicine exactly as told by your health care provider. Take it only when you need it. If your pain gets less severe, you may take less than your prescribed dose if your health care provider approves. If you  are not having pain, do nottake pain medicine unless your health care provider tells you to take it. If your pain is severe, do nottry to treat it yourself by taking more pills than instructed on your prescription. Contact your health care provider for help. Write down the times when you take your pain medicine. It is easy to become confused while on pain medicine. Writing the time can help you avoid overdose. Take other over-the-counter or prescription medicines only as told by your health care provider. Keeping yourself and others safe  While you are taking opioid pain medicine: Do not drive, use machinery, or power tools. Do not sign legal documents. Do not drink alcohol. Do not take sleeping pills. Do not supervise children by yourself. Do not do activities that require climbing or being in high places. Do not go to a lake, river, ocean, spa, or swimming pool. Do not share your pain medicine with anyone. Keep pain medicine in a locked cabinet or in a secure area where pets and children cannot reach it. Stopping your use of opioids If you have been taking opioid medicine for more than a few weeks, you may need to slowly decrease (taper) how much you take until you stop completely. Tapering your use of opioids can decrease your risk of symptoms of withdrawal, such as: Pain and cramping in the abdomen. Nausea. Sweating. Sleepiness. Restlessness. Uncontrollable shaking (tremors). Cravings for the medicine. Do not  attempt to taper your use of opioids on your own. Talk with your health care provider about how to do this. Your health care provider may prescribe a step-down schedule based on how much medicine you are taking and how long you have been taking it. Getting rid of leftover pills Do not save any leftover pills. Get rid of leftover pills safely by: Taking the medicine to a prescription take-back program. This is usually offered by the county or law enforcement. Bringing them to a  pharmacy that has a drug disposal container. Flushing them down the toilet. Check the label or package insert of your medicine to see whether this is safe to do. Throwing them out in the trash. Check the label or package insert of your medicine to see whether this is safe to do. If it is safe to throw it out, remove the medicine from the original container, put it into a sealable bag or container, and mix it with used coffee grounds, food scraps, dirt, or cat litter before putting it in the trash. Follow these instructions at home: Activity Do exercises as told by your health care provider. Avoid activities that make your pain worse. Return to your normal activities as told by your health care provider. Ask your health care provider what activities are safe for you. General instructions You may need to take these actions to prevent or treat constipation: Drink enough fluid to keep your urine pale yellow. Take over-the-counter or prescription medicines. Eat foods that are high in fiber, such as beans, whole grains, and fresh fruits and vegetables. Limit foods that are high in fat and processed sugars, such as fried or sweet foods. Keep all follow-up visits. This is important. Where to find support If you have been taking opioids for a long time, you may benefit from receiving support for quitting from a local support group or counselor. Ask your health care provider for a referral to these resources in your area. Where to find more information Centers for Disease Control and Prevention (CDC): http://www.wolf.info/ [place] Food and Drug Administration (FDA): [place] Get help right away if: You may have taken too much of an opioid (overdosed). Common symptoms of an overdose: Your breathing is slower or more shallow than normal. You have a very slow heartbeat (pulse). You have slurred speech. You have nausea and vomiting. Your pupils become very small. You have other potential symptoms: You are very  confused. You faint or feel like you will faint. You have cold, clammy skin. You have blue lips or fingernails. You have thoughts of harming yourself or harming others. These symptoms may represent a serious problem that is an emergency. Do not wait to see if the symptoms will go away. Get medical help right away. Call your local emergency services (911 in the [place] Do not drive yourself to the hospital.  If you ever feel like you may hurt yourself or others, or have thoughts about taking your own life, get help right away. Go to your nearest emergency department or: Call your local emergency services (911 in the [place] Call the [facility] 503 814 9877 in the [place] Call a suicide crisis helpline, such as the [place] at 613 402 0341 or 988 in the [place]. This is open 24 hours a day in the [place] Text the Crisis Text Line at 786-238-7289 (in the [place].). Summary Opioid medicines can help you manage moderate to severe pain for a short period of time. A pain treatment plan is an  agreement between you and your health care provider. Discuss the goals of your treatment, including how much pain you might expect to have and how you will manage the pain. If you think that you or someone else may have taken too much of an opioid, get medical help right away. This information is not intended to replace advice given to you by your health care provider. Make sure you discuss any questions you have with your health care provider. Document Revised: 10/10/2020 Document Reviewed: 06/27/2020 Elsevier Patient Education  [place] directives: Yes  Conditions/risks identified: None  Next appointment: Follow up in one year for your annual wellness visit.    Preventive Care 68 Years and Older, Male Preventive care refers to lifestyle choices and visits with your health care provider that can promote health and wellness. What does preventive care  include? A yearly physical exam. This is also called an annual well check. Dental exams once or twice a year. Routine eye exams. Ask your health care provider how often you should have your eyes checked. Personal lifestyle choices, including: Daily care of your teeth and gums. Regular physical activity. Eating a healthy diet. Avoiding tobacco and drug use. Limiting alcohol use. Practicing safe sex. Taking low doses of aspirin every day. Taking vitamin and mineral supplements as recommended by your health care provider. What happens during an annual well check? The services and screenings done by your health care provider during your annual well check will depend on your age, overall health, lifestyle risk factors, and family history of disease. Counseling  Your health care provider may ask you questions about your: Alcohol use. Tobacco use. Drug use. Emotional well-being. Home and relationship well-being. Sexual activity. Eating habits. History of falls. Memory and ability to understand (cognition). Work and work Statistician. Screening  You may have the following tests or measurements: Height, weight, and BMI. Blood pressure. Lipid and cholesterol levels. These may be checked every 5 years, or more frequently if you are over 88 years old. Skin check. Lung cancer screening. You may have this screening every year starting at age 59 if you have a 30-pack-year history of smoking and currently smoke or have quit within the past 15 years. Fecal occult blood test (FOBT) of the stool. You may have this test every year starting at age 75. Flexible sigmoidoscopy or colonoscopy. You may have a sigmoidoscopy every 5 years or a colonoscopy every 10 years starting at age 36. Prostate cancer screening. Recommendations will vary depending on your family history and other risks. Hepatitis C blood test. Hepatitis B blood test. Sexually transmitted disease (STD) testing. Diabetes screening. This  is done by checking your blood sugar (glucose) after you have not eaten for a while (fasting). You may have this done every 1-3 years. Abdominal aortic aneurysm (AAA) screening. You may need this if you are a current or former smoker. Osteoporosis. You may be screened starting at age 68 if you are at high risk. Talk with your health care provider about your test results, treatment options, and if necessary, the need for more tests. Vaccines  Your health care provider may recommend certain vaccines, such as: Influenza vaccine. This is recommended every year. Tetanus, diphtheria, and acellular pertussis (Tdap, Td) vaccine. You may need a Td booster every 10 years. Zoster vaccine. You may need this after age 17. Pneumococcal 13-valent conjugate (PCV13) vaccine. One dose is recommended after age 66. Pneumococcal polysaccharide (PPSV23) vaccine. One dose is recommended after age  14. Talk to your health care provider about which screenings and vaccines you need and how often you need them. This information is not intended to replace advice given to you by your health care provider. Make sure you discuss any questions you have with your health care provider. Document Released: 04/13/2015 Document Revised: 12/05/2015 Document Reviewed: 01/16/2015 Elsevier Interactive Patient Education  2017 [place] Prevention in the Home Falls can cause injuries. They can happen to people of all ages. There are many things you can do to make your home safe and to help prevent falls. What can I do on the outside of my home? Regularly fix the edges of walkways and driveways and fix any cracks. Remove anything that might make you trip as you walk through a door, such as a raised step or threshold. Trim any bushes or trees on the path to your home. Use bright outdoor lighting. Clear any walking paths of anything that might make someone trip, such as rocks or tools. Regularly check to see if handrails are  loose or broken. Make sure that both sides of any steps have handrails. Any raised decks and porches should have guardrails on the edges. Have any leaves, snow, or ice cleared regularly. Use sand or salt on walking paths during winter. Clean up any spills in your garage right away. This includes oil or grease spills. What can I do in the bathroom? Use night lights. Install grab bars by the toilet and in the tub and shower. Do not use towel bars as grab bars. Use non-skid mats or decals in the tub or shower. If you need to sit down in the shower, use a plastic, non-slip stool. Keep the floor dry. Clean up any water that spills on the floor as soon as it happens. Remove soap buildup in the tub or shower regularly. Attach bath mats securely with double-sided non-slip rug tape. Do not have throw rugs and other things on the floor that can make you trip. What can I do in the bedroom? Use night lights. Make sure that you have a light by your bed that is easy to reach. Do not use any sheets or blankets that are too big for your bed. They should not hang down onto the floor. Have a firm chair that has side arms. You can use this for support while you get dressed. Do not have throw rugs and other things on the floor that can make you trip. What can I do in the kitchen? Clean up any spills right away. Avoid walking on wet floors. Keep items that you use a lot in easy-to-reach places. If you need to reach something above you, use a strong step stool that has a grab bar. Keep electrical cords out of the way. Do not use floor polish or wax that makes floors slippery. If you must use wax, use non-skid floor wax. Do not have throw rugs and other things on the floor that can make you trip. What can I do with my stairs? Do not leave any items on the stairs. Make sure that there are handrails on both sides of the stairs and use them. Fix handrails that are broken or loose. Make sure that handrails are as  long as the stairways. Check any carpeting to make sure that it is firmly attached to the stairs. Fix any carpet that is loose or worn. Avoid having throw rugs at the top or bottom of the stairs. If you do have  throw rugs, attach them to the floor with carpet tape. Make sure that you have a light switch at the top of the stairs and the bottom of the stairs. If you do not have them, ask someone to add them for you. What else can I do to help prevent falls? Wear shoes that: Do not have high heels. Have rubber bottoms. Are comfortable and fit you well. Are closed at the toe. Do not wear sandals. If you use a stepladder: Make sure that it is fully opened. Do not climb a closed stepladder. Make sure that both sides of the stepladder are locked into place. Ask someone to hold it for you, if possible. Clearly mark and make sure that you can see: Any grab bars or handrails. First and last steps. Where the edge of each step is. Use tools that help you move around (mobility aids) if they are needed. These include: Canes. Walkers. Scooters. Crutches. Turn on the lights when you go into a dark area. Replace any light bulbs as soon as they burn out. Set up your furniture so you have a clear path. Avoid moving your furniture around. If any of your floors are uneven, fix them. If there are any pets around you, be aware of where they are. Review your medicines with your doctor. Some medicines can make you feel dizzy. This can increase your chance of falling. Ask your doctor what other things that you can do to help prevent falls. This information is not intended to replace advice given to you by your health care provider. Make sure you discuss any questions you have with your health care provider. Document Released: 01/11/2009 Document Revised: 08/23/2015 Document Reviewed: 04/21/2014 Elsevier Interactive Patient Education  2017 [employer].

## 2021-10-25 DIAGNOSIS — F419 Anxiety disorder, unspecified: Secondary | ICD-10-CM | POA: Diagnosis not present

## 2021-11-13 ENCOUNTER — Telehealth: Payer: Self-pay | Admitting: Family Medicine

## 2021-11-13 NOTE — Telephone Encounter (Signed)
Noted  

## 2021-11-13 NOTE — Telephone Encounter (Signed)
Pt called to remind clinic that his ex-wife is not to be contacted for any reason at all.  Pt was here a few months ago and submitted a new DPR, removing her contact info.

## 2022-01-24 DIAGNOSIS — H524 Presbyopia: Secondary | ICD-10-CM | POA: Diagnosis not present

## 2022-01-24 DIAGNOSIS — H5202 Hypermetropia, left eye: Secondary | ICD-10-CM | POA: Diagnosis not present

## 2022-01-28 DIAGNOSIS — F411 Generalized anxiety disorder: Secondary | ICD-10-CM | POA: Diagnosis not present

## 2022-03-26 DIAGNOSIS — F411 Generalized anxiety disorder: Secondary | ICD-10-CM | POA: Diagnosis not present

## 2022-06-28 IMAGING — DX DG ABDOMEN 1V
2 series · 2 of 2 positions shown · non-contrast
Comparison: March 14, 2020 and March 20, 2020

CLINICAL DATA: Post LEFT-sided nephrolithiasis, lithotripsy
follow-up.

EXAM:
ABDOMEN - 1 VIEW

[abdomen kub (1 of 2)]
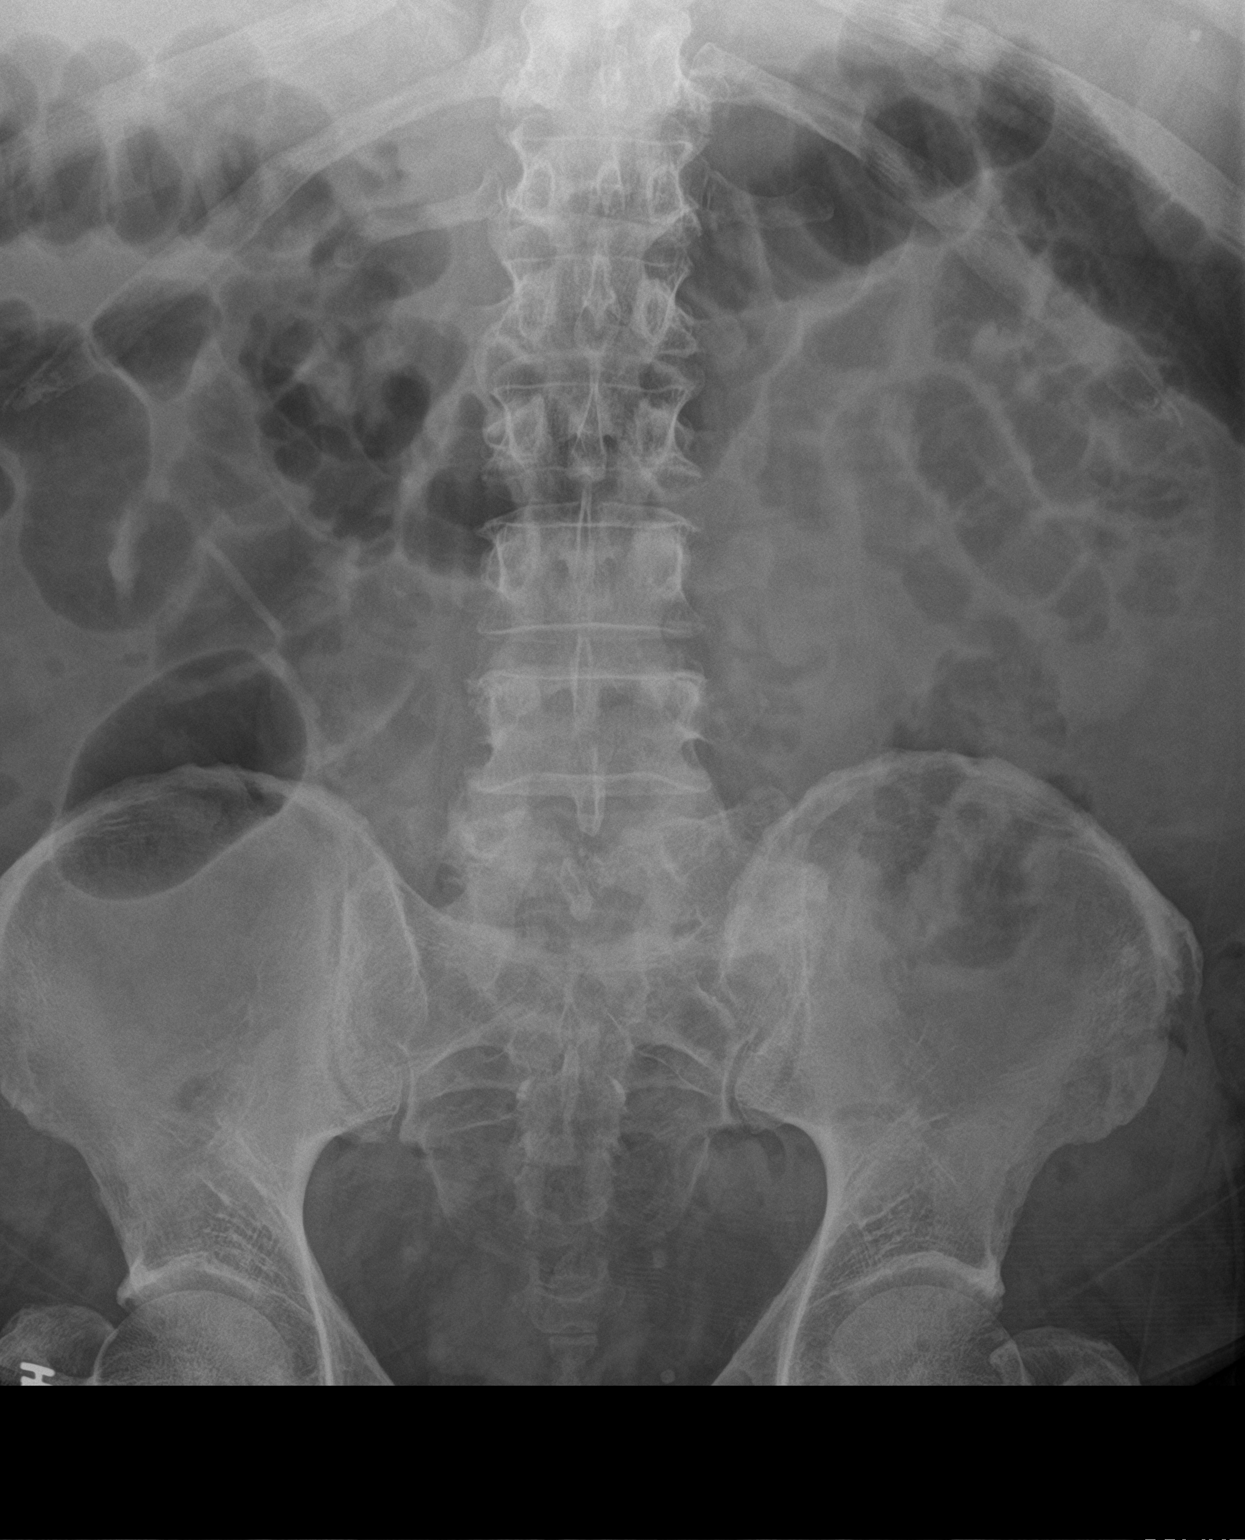

[abdomen kub (2 of 2)]
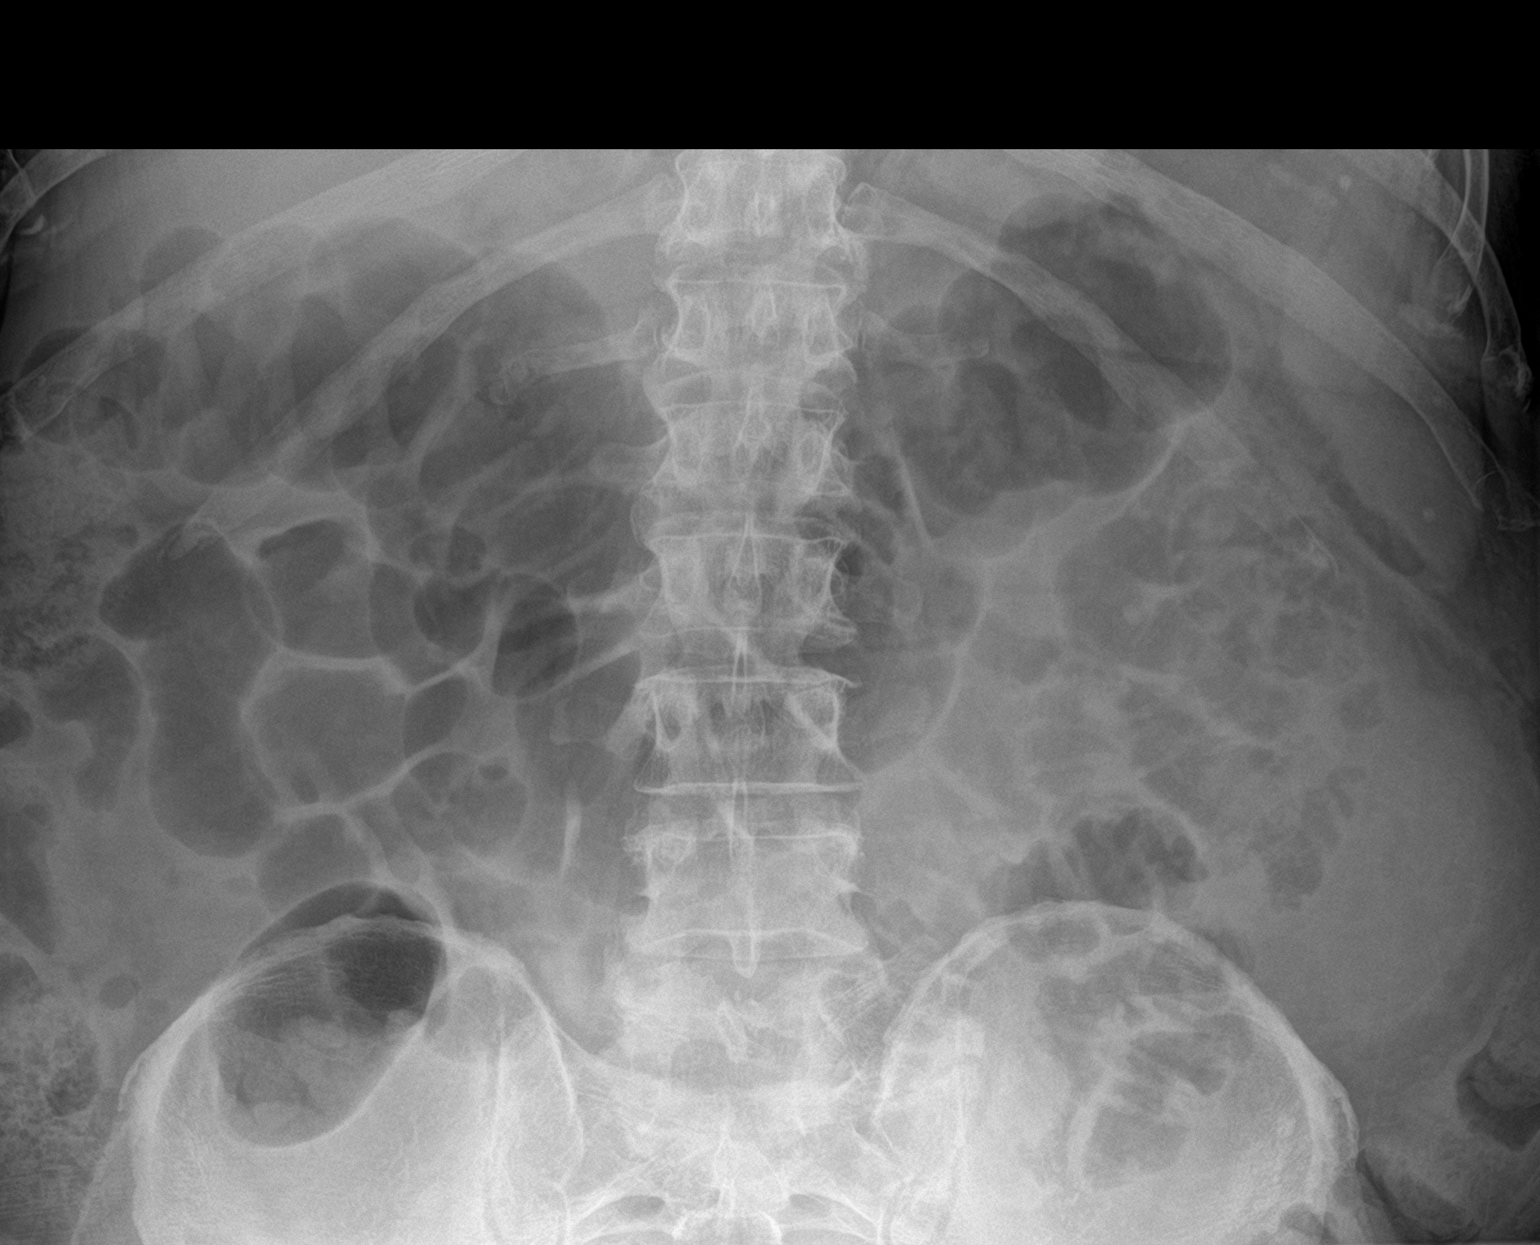

[2 of 2 positions shown; findings below may reference images not displayed]

FINDINGS: Mild gaseous distension of small bowel throughout the abdomen. Gas
in the colon. Bowel gas pattern skin similar to the study March 20, 2020. Postoperative changes about the low rectum with
suture line in place as before.

On limited assessment no acute skeletal process.

Gas overlying the renal contours bilaterally limits assessment in
these areas.

Phleboliths in the pelvis with a calcification along the inferior
margin of the sacrum in the LEFT hemipelvis measuring 4-5 mm,
previously projecting to the LEFT of the S3-S4 level now along the
lower margin of the sacrum.
IMPRESSION: 1. Probable migration of a distal LEFT ureteral calculus as
described., finding is of uncertain significance and projectional
changes are also possible causing apparent change in position.
2. Mild gaseous distension of small bowel throughout the abdomen
with postsurgical changes about the rectum. Correlate with any signs
of ileus.
3. No visible renal calculi.

## 2022-10-15 ENCOUNTER — Ambulatory Visit (INDEPENDENT_AMBULATORY_CARE_PROVIDER_SITE_OTHER): Payer: Medicare Other

## 2022-10-15 VITALS — Ht 66.0 in | Wt 218.0 lb

## 2022-10-15 DIAGNOSIS — Z0000 Encounter for general adult medical examination without abnormal findings: Secondary | ICD-10-CM

## 2022-10-15 NOTE — Patient Instructions (Addendum)
[name] , Thank you for taking time to come for your Medicare Wellness Visit. I appreciate your ongoing commitment to your health goals. Please review the following plan we discussed and let me know if I can assist you in the future.   These are the goals we discussed:  Goals       Increase physical activity (pt-stated)      Get weight in control and get diet in order.      No cureent goals (pt-stated)      Weight Loss Achieved      Evidence-based guidance:  Review medication that may contribute to weight gain, such as corticosteroid, beta-blocker, tricyclic antidepressant, oral antihyperglycemic; advocate for changes when appropriate.  Perform or refer to registered dietitian to perform comprehensive nutrition assessment that includes disordered-eating behaviors, such as binge-eating, emotional or compulsive eating, grazing.  Counsel patient regarding health risks of obesity and that weight loss goal of 5 to 10 percent of initial weight will improve risk.  Recommend initial weight loss goal of 3 to 5 percent of bodyweight; increase weight-loss goals based on patient success as achieving greater weight loss continues to reduce risk.  Propose a calorie-reduced diet based on the patient's preferences and health status.  Provide ongoing emotional support or cognitive behavioral therapy and dietitian services (individual, group, virtual) over at least 6 months with a minimum of 14 encounters to best facilitate weight loss.  Provide monthly follow-up for 12 months when weight loss goal is met to assist with maintenance of weight loss.  Encourage increased physical activity or exercise based on individual age, risk, and ability up to 200 to 300 minutes per week that includes aerobic and resistance training.  Encourage reduction in sedentary behaviors by replacing them with nonexercise yet active leisure pursuits.  Identify physical barriers, such as change in posture, balance, gait patterns, joint  pain, and environmental barriers to activity.  Consider referral to rehabilitation therapy, especially when mobility or function is impaired due to osteoarthritis and obesity.  Consider referral to weight-loss program that has published evidence of safety and efficacy if on-site intensive intervention is unavailable or patient preference.  Prepare patient for use of pharmacologic therapy as an adjunct to lifestyle changes based on body mass index, patient agreement and presence of risk factors or comorbidities.  Evaluate efficacy of pharmacologic therapy (weight loss) and tolerance to medication periodically.  Engage in shared decision-making regarding referral to bariatric surgeon for consultation and evaluation when weight-loss goal has not been accomplished by behavioral therapy with or without pharmacologic therapy.   Notes:         This is a list of the screening recommended for you and due dates:  Health Maintenance  Topic Date Due   Complete foot exam   Never done   DTaP/Tdap/Td vaccine (1 - Tdap) Never done   Colon Cancer Screening  02/20/2019   Hemoglobin A1C  04/21/2021   Eye exam for diabetics  10/15/2022*   Yearly kidney health urinalysis for diabetes  10/16/2022*   COVID-19 Vaccine (1 - 2023-24 season) 10/31/2022*   Yearly kidney function blood test for diabetes  01/15/2023*   Zoster (Shingles) Vaccine (1 of 2) 01/15/2023*   Pneumonia Vaccine (1 of 1 - PCV) 10/15/2023*   Hepatitis C Screening  10/15/2023*   Medicare Annual Wellness Visit  10/15/2023   HPV Vaccine  Aged Out  *Topic was postponed. The date shown is not the original due date.    Advanced directives: Please bring a  copy of your health care power of attorney and living will to the office to be added to your chart at your convenience.   Conditions/risks identified: None  Next appointment: Follow up in one year for your annual wellness visit.   Preventive Care 104 Years and Older, Male  Preventive care  refers to lifestyle choices and visits with your health care provider that can promote health and wellness. What does preventive care include? A yearly physical exam. This is also called an annual well check. Dental exams once or twice a year. Routine eye exams. Ask your health care provider how often you should have your eyes checked. Personal lifestyle choices, including: Daily care of your teeth and gums. Regular physical activity. Eating a healthy diet. Avoiding tobacco and drug use. Limiting alcohol use. Practicing safe sex. Taking low doses of aspirin every day. Taking vitamin and mineral supplements as recommended by your health care provider. What happens during an annual well check? The services and screenings done by your health care provider during your annual well check will depend on your age, overall health, lifestyle risk factors, and family history of disease. Counseling  Your health care provider may ask you questions about your: Alcohol use. Tobacco use. Drug use. Emotional well-being. Home and relationship well-being. Sexual activity. Eating habits. History of falls. Memory and ability to understand (cognition). Work and work Astronomer. Screening  You may have the following tests or measurements: Height, weight, and BMI. Blood pressure. Lipid and cholesterol levels. These may be checked every 5 years, or more frequently if you are over 14 years old. Skin check. Lung cancer screening. You may have this screening every year starting at age 69 if you have a 30-pack-year history of smoking and currently smoke or have quit within the past 15 years. Fecal occult blood test (FOBT) of the stool. You may have this test every year starting at age 51. Flexible sigmoidoscopy or colonoscopy. You may have a sigmoidoscopy every 5 years or a colonoscopy every 10 years starting at age 23. Prostate cancer screening. Recommendations will vary depending on your family history  and other risks. Hepatitis C blood test. Hepatitis B blood test. Sexually transmitted disease (STD) testing. Diabetes screening. This is done by checking your blood sugar (glucose) after you have not eaten for a while (fasting). You may have this done every 1-3 years. Abdominal aortic aneurysm (AAA) screening. You may need this if you are a current or former smoker. Osteoporosis. You may be screened starting at age 44 if you are at high risk. Talk with your health care provider about your test results, treatment options, and if necessary, the need for more tests. Vaccines  Your health care provider may recommend certain vaccines, such as: Influenza vaccine. This is recommended every year. Tetanus, diphtheria, and acellular pertussis (Tdap, Td) vaccine. You may need a Td booster every 10 years. Zoster vaccine. You may need this after age 69. Pneumococcal 13-valent conjugate (PCV13) vaccine. One dose is recommended after age 52. Pneumococcal polysaccharide (PPSV23) vaccine. One dose is recommended after age 68. Talk to your health care provider about which screenings and vaccines you need and how often you need them. This information is not intended to replace advice given to you by your health care provider. Make sure you discuss any questions you have with your health care provider. Document Released: 04/13/2015 Document Revised: 12/05/2015 Document Reviewed: 01/16/2015 Elsevier Interactive Patient Education  2017 [employer].  Fall Prevention in the Home Falls can cause  injuries. They can happen to people of all ages. There are many things you can do to make your home safe and to help prevent falls. What can I do on the outside of my home? Regularly fix the edges of walkways and driveways and fix any cracks. Remove anything that might make you trip as you walk through a door, such as a raised step or threshold. Trim any bushes or trees on the path to your home. Use bright outdoor  lighting. Clear any walking paths of anything that might make someone trip, such as rocks or tools. Regularly check to see if handrails are loose or broken. Make sure that both sides of any steps have handrails. Any raised decks and porches should have guardrails on the edges. Have any leaves, snow, or ice cleared regularly. Use sand or salt on walking paths during winter. Clean up any spills in your garage right away. This includes oil or grease spills. What can I do in the bathroom? Use night lights. Install grab bars by the toilet and in the tub and shower. Do not use towel bars as grab bars. Use non-skid mats or decals in the tub or shower. If you need to sit down in the shower, use a plastic, non-slip stool. Keep the floor dry. Clean up any water that spills on the floor as soon as it happens. Remove soap buildup in the tub or shower regularly. Attach bath mats securely with double-sided non-slip rug tape. Do not have throw rugs and other things on the floor that can make you trip. What can I do in the bedroom? Use night lights. Make sure that you have a light by your bed that is easy to reach. Do not use any sheets or blankets that are too big for your bed. They should not hang down onto the floor. Have a firm chair that has side arms. You can use this for support while you get dressed. Do not have throw rugs and other things on the floor that can make you trip. What can I do in the kitchen? Clean up any spills right away. Avoid walking on wet floors. Keep items that you use a lot in easy-to-reach places. If you need to reach something above you, use a strong step stool that has a grab bar. Keep electrical cords out of the way. Do not use floor polish or wax that makes floors slippery. If you must use wax, use non-skid floor wax. Do not have throw rugs and other things on the floor that can make you trip. What can I do with my stairs? Do not leave any items on the stairs. Make  sure that there are handrails on both sides of the stairs and use them. Fix handrails that are broken or loose. Make sure that handrails are as long as the stairways. Check any carpeting to make sure that it is firmly attached to the stairs. Fix any carpet that is loose or worn. Avoid having throw rugs at the top or bottom of the stairs. If you do have throw rugs, attach them to the floor with carpet tape. Make sure that you have a light switch at the top of the stairs and the bottom of the stairs. If you do not have them, ask someone to add them for you. What else can I do to help prevent falls? Wear shoes that: Do not have high heels. Have rubber bottoms. Are comfortable and fit you well. Are closed at the toe. Do  not wear sandals. If you use a stepladder: Make sure that it is fully opened. Do not climb a closed stepladder. Make sure that both sides of the stepladder are locked into place. Ask someone to hold it for you, if possible. Clearly mark and make sure that you can see: Any grab bars or handrails. First and last steps. Where the edge of each step is. Use tools that help you move around (mobility aids) if they are needed. These include: Canes. Walkers. Scooters. Crutches. Turn on the lights when you go into a dark area. Replace any light bulbs as soon as they burn out. Set up your furniture so you have a clear path. Avoid moving your furniture around. If any of your floors are uneven, fix them. If there are any pets around you, be aware of where they are. Review your medicines with your doctor. Some medicines can make you feel dizzy. This can increase your chance of falling. Ask your doctor what other things that you can do to help prevent falls. This information is not intended to replace advice given to you by your health care provider. Make sure you discuss any questions you have with your health care provider. Document Released: 01/11/2009 Document Revised: 08/23/2015  Document Reviewed: 04/21/2014 Elsevier Interactive Patient Education  2017 [employer].

## 2022-10-15 NOTE — Progress Notes (Signed)
Subjective:   Alejandro Hill is a 67 y.o. male who presents for Medicare Annual/Subsequent preventive examination.  Visit Complete: Virtual  I connected with  WARDELL POKORSKI on 10/15/22 by a audio enabled telemedicine application and verified that I am speaking with the correct person using two identifiers.  Patient Location: Home  Provider Location: Home Office  I discussed the limitations of evaluation and management by telemedicine. The patient expressed understanding and agreed to proceed.  Patient Medicare AWV questionnaire was completed by the patient on ; I have confirmed that all information answered by patient is correct and no changes since this date.  Review of Systems     Cardiac Risk Factors include: advanced age (>56men, >75 women);male gender;hypertension     Objective:    Today's Vitals   10/15/22 1405  Weight: 218 lb (98.9 kg)  Height: 5\' 6"  (1.676 m)   Body mass index is 35.19 kg/m.     10/15/2022    2:15 PM 10/10/2021    1:57 PM 10/08/2020   10:44 AM 03/26/2020    9:57 AM 02/21/2014    8:21 AM 02/13/2014    8:42 AM 12/07/2013    5:20 PM  Advanced Directives  Does Patient Have a Medical Advance Directive? Yes Yes Yes Yes No Yes No  Type of Estate agent of [place];Living will Healthcare Power of [place];Living will Healthcare Power of [place];Living will   Healthcare Power of [place];Living will   Does patient want to make changes to medical advance directive?  No - Patient declined  No - Guardian declined  No - Patient declined   Copy of Healthcare Power of Attorney in Chart? No - copy requested No - copy requested No - copy requested   No - copy requested   Would patient like information on creating a medical advance directive?    No - Patient declined Yes - Educational materials given      Current Medications (verified) Outpatient Encounter Medications as of 10/15/2022  Medication Sig   ascorbic acid (VITAMIN C) 1000 MG tablet  Take 1,000 mg by mouth daily.   B Complex Vitamins (VITAMIN B COMPLEX PO) Take 1 tablet by mouth daily.   Cholecalciferol (VITAMIN D-3) 125 MCG (5000 UT) TABS Take 1 tablet by mouth daily.   Cinnamon 500 MG TABS Take 2 tablets by mouth daily.   fexofenadine-pseudoephedrine (ALLEGRA-D ALLERGY & CONGESTION) 180-240 MG 24 hr tablet Take 1 tablet by mouth daily.   fluticasone (FLONASE) 50 MCG/ACT nasal spray USE 2 SPRAYS INTO EACH NOSTRIL EVERY DAY   Garlic 600 MG CAPS Take 2 capsules by mouth daily.    ivermectin (STROMECTOL) 3 MG TABS tablet Take 12 mg by mouth every Sunday.   KRILL OIL PO Take 1 capsule by mouth daily.   LYSINE PO Take 1 tablet by mouth daily.    methylPREDNISolone (MEDROL DOSEPAK) 4 MG TBPK tablet As directed   montelukast (SINGULAIR) 10 MG tablet TAKE 1 TABLET AT BEDTIME   Multiple Vitamin (MULTIVITAMIN) tablet Take 1 tablet by mouth daily.   NON FORMULARY 5 mcg daily. Sea [place]p   OVER THE COUNTER MEDICATION Take 2 tablets by mouth daily. Triple strength curimin supplement   Red Yeast Rice Extract (RED YEAST RICE PO) Take 1 capsule by mouth 2 (two) times daily.    verapamil (CALAN-SR) 240 MG CR tablet TAKE 1 TABLET EVERY DAY   No facility-administered encounter medications on file as of 10/15/2022.    Allergies (verified) Cefuroxime  axetil, Codeine, Erythromycin, Morphine and codeine, Ppd [tuberculin purified protein derivative], Sulfa antibiotics, Norco [hydrocodone-acetaminophen], and Penicillins   History: Past Medical History:  Diagnosis Date   Anemia    as a child   Asthma    exertional, seasonal (spring)   Diabetes mellitus    type II diabetes     Heart murmur    as a child   Hemorrhoids    Hyperlipidemia    Hypertension    Hypogonadism male    sees Dr. Retta Diones   Kidney stone    Migraine    cluster, [facility]   Pneumonia 2012   Seasonal allergies    Sleep apnea    , uses cpap   Thyroid disease    hypothyroidism - not  taking medications except for [facility]   Umbilical hernia    Past Surgical History:  Procedure Laterality Date   CATARACT EXTRACTION Bilateral 01/30/2011   with lens implant   COLONOSCOPY  01/18/2019   Cologuard was negative   EXTRACORPOREAL SHOCK WAVE LITHOTRIPSY Left 03/26/2020   Procedure: EXTRACORPOREAL SHOCK WAVE LITHOTRIPSY (ESWL);  Surgeon: Crist Fat, MD;  [facility];  Service: Urology;  Laterality: Left;   EYE SURGERY Bilateral    RK in his 57's, lasik when he was 1   HEMORRHOID SURGERY N/A 02/20/2014   Procedure: HEMORRHOIDECTOMY;  Surgeon: Chevis Pretty III, MD;  Location: [facility] OR;  Service: General;  Laterality: N/A;   HERNIA REPAIR  08/03/2007   left inguinal herniorrhaphy. Dr Carolynne Edouard   INSERTION OF MESH N/A 02/20/2014   Procedure: INSERTION OF MESH;  Surgeon: Chevis Pretty III, MD;  Location: [facility] OR;  Service: General;  Laterality: N/A;   TONSILLECTOMY     UMBILICAL HERNIA REPAIR  02/20/2014   & EXTERNAL HEMORRHOROIDS     DR TOTH   UMBILICAL HERNIA REPAIR N/A 02/20/2014   Procedure: HERNIA REPAIR UMBILICAL ADULT;  Surgeon: Chevis Pretty III, MD;  Location: [facility] OR;  Service: General;  Laterality: N/A;   UVULOPALATOPHARYNGOPLASTY     per Dr Haroldine Laws   VASECTOMY     WISDOM TOOTH EXTRACTION     Family History  Problem Relation Age of Onset   Heart disease Mother    Liver disease Father    Lung cancer Father    Heart disease Other    Hyperlipidemia Other    Stroke Other    Social History   Socioeconomic History   Marital status: Married    Spouse name: Not on file   Number of children: Not on file   Years of education: Not on file   Highest education level: Not on file  Occupational History   Occupation: self employed  Tobacco Use   Smoking status: Never   Smokeless tobacco: Never  Substance and Sexual Activity   Alcohol use: Yes    Alcohol/week: 0.0 standard drinks of alcohol    Comment: 1-2 drink per week   Drug use: No   Sexual  activity: Not on file  Other Topics Concern   Not on file  Social History Narrative   Regular Exercise:  Yes   Religion affecting care (Jehovah"s Witness) so will not accept blood products.   Social Determinants of Health   Financial Resource Strain: Low Risk  (10/15/2022)   Overall Financial Resource Strain (CARDIA)    Difficulty of Paying Living Expenses: Not hard at all  Food Insecurity: No Food Insecurity (10/15/2022)   Hunger Vital Sign  Worried About Programme researcher, broadcasting/film/video in the Last Year: Never true    Ran Out of Food in the Last Year: Never true  Transportation Needs: No Transportation Needs (10/15/2022)   PRAPARE - Administrator, Civil Service (Medical): No    Lack of Transportation (Non-Medical): No  Physical Activity: Inactive (10/15/2022)   Exercise Vital Sign    Days of Exercise per Week: 0 days    Minutes of Exercise per Session: 0 min  Stress: Stress Concern Present (10/15/2022)   [employer] of Occupational Health - Occupational Stress Questionnaire    Feeling of Stress : To some extent  Social Connections: Socially Integrated (10/15/2022)   Social Connection and Isolation Panel [NHANES]    Frequency of Communication with Friends and Family: More than three times a week    Frequency of Social Gatherings with Friends and Family: More than three times a week    Attends Religious Services: More than 4 times per year    Active Member of [employer] or Organizations: Yes    Attends Engineer, structural: More than 4 times per year    Marital Status: Married    Tobacco Counseling Counseling given: Not Answered   Clinical Intake:  Pre-visit preparation completed: No  Pain : No/denies pain     BMI - recorded: 39.19 Nutritional Status: BMI > 30  Obese Nutritional Risks: None Diabetes: No  How often do you need to have someone help you when you read instructions, pamphlets, or other written materials from your doctor or pharmacy?: 1 -  Never  Interpreter Needed?: No  Information entered by :: Theresa Mulligan LPN   Activities of Daily Living    10/15/2022    2:13 PM  In your present state of health, do you have any difficulty performing the following activities:  Hearing? 0  Vision? 0  Difficulty concentrating or making decisions? 0  Walking or climbing stairs? 0  Dressing or bathing? 0  Doing errands, shopping? 0  Preparing Food and eating ? N  Using the Toilet? N  In the past six months, have you accidently leaked urine? N  Do you have problems with loss of bowel control? N  Managing your Medications? N  Managing your Finances? N  Housekeeping or managing your Housekeeping? N    Patient Care Team: Nelwyn Salisbury, MD as PCP - General Beaulah Dinning, Bonnita Hollow, MD (Inactive) as Referring Physician (Allergy)  Indicate any recent Medical Services you may have received from other than [facility] providers in the past year (date may be approximate).     Assessment:   This is a routine wellness examination for [place].  Hearing/Vision screen Hearing Screening - Comments:: Denies hearing difficulties   Vision Screening - Comments:: Wears rx glasses - up to date with routine eye exams with  [facility]  Dietary issues and exercise activities discussed:     Goals Addressed               This Visit's Progress     Increase physical activity (pt-stated)        Get weight in control and get diet in order.      Depression Screen    10/15/2022    2:11 PM 10/10/2021    1:54 PM 09/11/2021    8:32 AM 10/08/2020   10:48 AM 10/08/2020   10:41 AM  PHQ 2/9 Scores  PHQ - 2 Score 0 0 0 0 0  PHQ- 9 Score  0  2      Fall Risk    10/15/2022    2:14 PM 10/10/2021    1:56 PM 10/09/2021    6:41 PM 09/11/2021    8:32 AM 10/08/2020   10:48 AM  Fall Risk   Falls in the past year? 0 0 0 0 0  Number falls in past yr: 0 0 0 0 0  Injury with Fall? 0 0 0 0 0  Risk for fall due to : No Fall Risks No Fall Risks  No Fall Risks    Follow up Falls prevention discussed   Falls evaluation completed Falls evaluation completed    MEDICARE RISK AT HOME:  Medicare Risk at Home - 10/15/22 1423     Any stairs in or around the home? Yes    If so, are there any without handrails? No    Home free of loose throw rugs in walkways, pet beds, electrical cords, etc? Yes    Adequate lighting in your home to reduce risk of falls? Yes    Life alert? No    Use of a cane, walker or w/c? No    Grab bars in the bathroom? Yes    Shower chair or bench in shower? No    Elevated toilet seat or a handicapped toilet? No             TIMED UP AND GO:  Was the test performed?  No    Cognitive Function:        10/15/2022    2:16 PM 10/10/2021    1:58 PM  6CIT Screen  What Year? 0 points 0 points  What month? 0 points 0 points  What time? 0 points 0 points  Count back from 20 0 points 0 points  Months in reverse 0 points 0 points  Repeat phrase 0 points 0 points  Total Score 0 points 0 points    Immunizations  There is no immunization history on file for this patient.  TDAP status: Due, Education has been provided regarding the importance of this vaccine. Advised may receive this vaccine at local pharmacy or Health Dept. Aware to provide a copy of the vaccination record if obtained from local pharmacy or Health Dept. Verbalized acceptance and understanding.  Flu Vaccine status: Declined, Education has been provided regarding the importance of this vaccine but patient still declined. Advised may receive this vaccine at local pharmacy or Health Dept. Aware to provide a copy of the vaccination record if obtained from local pharmacy or Health Dept. Verbalized acceptance and understanding.  Pneumococcal vaccine status: Declined,  Education has been provided regarding the importance of this vaccine but patient still declined. Advised may receive this vaccine at local pharmacy or Health Dept. Aware to provide a copy of the  vaccination record if obtained from local pharmacy or Health Dept. Verbalized acceptance and understanding.   Covid-19 vaccine status: Declined, Education has been provided regarding the importance of this vaccine but patient still declined. Advised may receive this vaccine at local pharmacy or Health Dept.or vaccine clinic. Aware to provide a copy of the vaccination record if obtained from local pharmacy or Health Dept. Verbalized acceptance and understanding.  Qualifies for Shingles Vaccine? Yes   Zostavax completed No   Shingrix Completed?: No.    Education has been provided regarding the importance of this vaccine. Patient has been advised to call insurance company to determine out of pocket expense if they have not yet received this vaccine. Advised may also receive  vaccine at local pharmacy or Health Dept. Verbalized acceptance and understanding.  Screening Tests Health Maintenance  Topic Date Due   FOOT EXAM  Never done   DTaP/Tdap/Td (1 - Tdap) Never done   Colonoscopy  02/20/2019   HEMOGLOBIN A1C  04/21/2021   OPHTHALMOLOGY EXAM  10/15/2022 (Originally 08/17/1965)   Diabetic kidney evaluation - Urine ACR  10/16/2022 (Originally 01/26/2015)   COVID-19 Vaccine (1 - 2023-24 season) 10/31/2022 (Originally 11/29/2021)   Diabetic kidney evaluation - eGFR measurement  01/15/2023 (Originally 10/19/2021)   Zoster Vaccines- Shingrix (1 of 2) 01/15/2023 (Originally 08/17/2003)   Pneumonia Vaccine 90+ Years old (1 of 1 - PCV) 10/15/2023 (Originally 08/17/2018)   Hepatitis C Screening  10/15/2023 (Originally 08/17/1973)   Medicare Annual Wellness (AWV)  10/15/2023   HPV VACCINES  Aged Out    Health Maintenance  Health Maintenance Due  Topic Date Due   FOOT EXAM  Never done   DTaP/Tdap/Td (1 - Tdap) Never done   Colonoscopy  02/20/2019   HEMOGLOBIN A1C  04/21/2021    Colorectal cancer screening: Referral to GI placed Deferred. Pt aware the office will call re: appt.  Lung Cancer Screening:  (Low Dose CT Chest recommended if Age 13-80 years, 20 pack-year currently smoking OR have quit w/in 15years.) does not qualify.     Additional Screening:  Hepatitis C Screening: does qualify;  Deferred  Vision Screening: Recommended annual ophthalmology exams for early detection of glaucoma and other disorders of the eye. Is the patient up to date with their annual eye exam?  Yes  Who is the provider or what is the name of the office in which the patient attends annual eye exams? [facility] If pt is not established with a provider, would they like to be referred to a provider to establish care? No .   Dental Screening: Recommended annual dental exams for proper oral hygiene    Community Resource Referral / Chronic Care Management:  CRR required this visit?  No   CCM required this visit?  No     Plan:     I have personally reviewed and noted the following in the patient's chart:   Medical and social history Use of alcohol, tobacco or illicit drugs  Current medications and supplements including opioid prescriptions. Patient is not currently taking opioid prescriptions. Functional ability and status Nutritional status Physical activity Advanced directives List of other physicians Hospitalizations, surgeries, and ER visits in previous 12 months Vitals Screenings to include cognitive, depression, and falls Referrals and appointments  In addition, I have reviewed and discussed with patient certain preventive protocols, quality metrics, and best practice recommendations. A written personalized care plan for preventive services as well as general preventive health recommendations were provided to patient.     Tillie Rung, LPN   1/61/0960   After Visit Summary: (Mail) Due to this being a telephonic visit, the after visit summary with patients personalized plan was offered to patient via mail   Nurse Notes: Patient due Hemoglobin A1C, Diabetic kidney evaluation  eGFR and Hep-C Screening

## 2022-11-27 ENCOUNTER — Ambulatory Visit (INDEPENDENT_AMBULATORY_CARE_PROVIDER_SITE_OTHER): Payer: Medicare Other | Admitting: Family Medicine

## 2022-11-27 ENCOUNTER — Encounter: Payer: Self-pay | Admitting: Family Medicine

## 2022-11-27 VITALS — BP 138/84 | HR 66 | Temp 98.3°F | Ht 66.0 in | Wt 236.0 lb

## 2022-11-27 DIAGNOSIS — E782 Mixed hyperlipidemia: Secondary | ICD-10-CM

## 2022-11-27 DIAGNOSIS — I10 Essential (primary) hypertension: Secondary | ICD-10-CM | POA: Diagnosis not present

## 2022-11-27 DIAGNOSIS — G4733 Obstructive sleep apnea (adult) (pediatric): Secondary | ICD-10-CM

## 2022-11-27 DIAGNOSIS — N401 Enlarged prostate with lower urinary tract symptoms: Secondary | ICD-10-CM

## 2022-11-27 DIAGNOSIS — N138 Other obstructive and reflux uropathy: Secondary | ICD-10-CM

## 2022-11-27 DIAGNOSIS — F411 Generalized anxiety disorder: Secondary | ICD-10-CM | POA: Diagnosis not present

## 2022-11-27 DIAGNOSIS — M542 Cervicalgia: Secondary | ICD-10-CM

## 2022-11-27 DIAGNOSIS — J45909 Unspecified asthma, uncomplicated: Secondary | ICD-10-CM | POA: Diagnosis not present

## 2022-11-27 DIAGNOSIS — E119 Type 2 diabetes mellitus without complications: Secondary | ICD-10-CM | POA: Diagnosis not present

## 2022-11-27 DIAGNOSIS — G8929 Other chronic pain: Secondary | ICD-10-CM

## 2022-11-27 DIAGNOSIS — Z1211 Encounter for screening for malignant neoplasm of colon: Secondary | ICD-10-CM

## 2022-11-27 DIAGNOSIS — E039 Hypothyroidism, unspecified: Secondary | ICD-10-CM

## 2022-11-27 LAB — LIPID PANEL
Cholesterol: 205 mg/dL — ABNORMAL HIGH (ref 0–200)
HDL: 41.9 mg/dL (ref >39.00)
LDL Cholesterol: 127 mg/dL — ABNORMAL HIGH (ref 0–99)
NonHDL: 163.45
Total CHOL/HDL Ratio: 5
Triglycerides: 181 mg/dL — ABNORMAL HIGH (ref 0.0–149.0)
VLDL: 36.2 mg/dL (ref 0.0–40.0)

## 2022-11-27 LAB — CBC WITH DIFFERENTIAL/PLATELET
Basophils Absolute: 0 10*3/uL (ref 0.0–0.1)
Basophils Relative: 0.4 % (ref 0.0–3.0)
Eosinophils Absolute: 0.1 10*3/uL (ref 0.0–0.7)
Eosinophils Relative: 1.4 % (ref 0.0–5.0)
HCT: 45.1 % (ref 39.0–52.0)
Hemoglobin: 15.4 g/dL (ref 13.0–17.0)
Lymphocytes Relative: 18.9 % (ref 12.0–46.0)
Lymphs Abs: 1.1 10*3/uL (ref 0.7–4.0)
MCHC: 34.2 g/dL (ref 30.0–36.0)
MCV: 88.2 fl (ref 78.0–100.0)
Monocytes Absolute: 0.5 10*3/uL (ref 0.1–1.0)
Monocytes Relative: 9.2 % (ref 3.0–12.0)
Neutro Abs: 4 10*3/uL (ref 1.4–7.7)
Neutrophils Relative %: 70.1 % (ref 43.0–77.0)
Platelets: 185 10*3/uL (ref 150.0–400.0)
RBC: 5.11 Mil/uL (ref 4.22–5.81)
RDW: 12.8 % (ref 11.5–15.5)
WBC: 5.8 10*3/uL (ref 4.0–10.5)

## 2022-11-27 LAB — BASIC METABOLIC PANEL
BUN: 14 mg/dL (ref 6–23)
CO2: 29 mEq/L (ref 19–32)
Calcium: 8.7 mg/dL (ref 8.4–10.5)
Chloride: 102 mEq/L (ref 96–112)
Creatinine, Ser: 0.83 mg/dL (ref 0.40–1.50)
GFR: 89.44 mL/min (ref >60.00)
Glucose, Bld: 138 mg/dL — ABNORMAL HIGH (ref 70–99)
Potassium: 3.7 mEq/L (ref 3.5–5.1)
Sodium: 140 mEq/L (ref 135–145)

## 2022-11-27 LAB — HEPATIC FUNCTION PANEL
ALT: 30 U/L (ref 0–53)
AST: 23 U/L (ref 0–37)
Albumin: 4.2 g/dL (ref 3.5–5.2)
Alkaline Phosphatase: 81 U/L (ref 39–117)
Bilirubin, Direct: 0.2 mg/dL (ref 0.0–0.3)
Total Bilirubin: 0.9 mg/dL (ref 0.2–1.2)
Total Protein: 6.6 g/dL (ref 6.0–8.3)

## 2022-11-27 LAB — HEMOGLOBIN A1C: Hgb A1c MFr Bld: 7.7 % — ABNORMAL HIGH (ref 4.6–6.5)

## 2022-11-27 LAB — PSA: PSA: 1.19 ng/mL (ref 0.10–4.00)

## 2022-11-27 LAB — TSH: TSH: 1.18 u[IU]/mL (ref 0.35–5.50)

## 2022-11-27 MED ORDER — METHYLPREDNISOLONE 4 MG PO TBPK
ORAL_TABLET | ORAL | 0 refills | Status: DC
Start: 2022-11-27 — End: 2022-12-10

## 2022-11-27 MED ORDER — FEXOFENADINE-PSEUDOEPHED ER 180-240 MG PO TB24: 1.0000 | ORAL_TABLET | Freq: Every day | ORAL | 3 refills | Status: DC

## 2022-11-27 NOTE — Progress Notes (Signed)
Subjective:    Patient ID: Alejandro Hill, male    DOB: June 16, 1955, 67 y.o.   MRN: [id number]  HPI Here to follow up on issues. He feels well in general. He stopped taking Verapamil about 3 months ago and is taking an OTC supplement called Hawthorne berry instead, but his BP has remained under control. He takes cinnamon and watches his diet to control diabetes. His allergies are stable. His neck pain and OSA are stable.    Review of Systems  Constitutional: Negative.   HENT: Negative.    Eyes: Negative.   Respiratory: Negative.    Cardiovascular: Negative.   Gastrointestinal: Negative.   Genitourinary: Negative.   Musculoskeletal: Negative.   Skin: Negative.   Neurological: Negative.   Psychiatric/Behavioral: Negative.         Objective:   Physical Exam Constitutional:      General: He is not in acute distress.    Appearance: He is well-developed. He is obese. He is not diaphoretic.  HENT:     Head: Normocephalic and atraumatic.     Right Ear: External ear normal.     Left Ear: External ear normal.     Nose: Nose normal.     Mouth/Throat:     Pharynx: No oropharyngeal exudate.  Eyes:     General: No scleral icterus.       Right eye: No discharge.        Left eye: No discharge.     Conjunctiva/sclera: Conjunctivae normal.     Pupils: Pupils are equal, round, and reactive to light.  Neck:     Thyroid: No thyromegaly.     Vascular: No JVD.     Trachea: No tracheal deviation.  Cardiovascular:     Rate and Rhythm: Normal rate and regular rhythm.     Pulses: Normal pulses.     Heart sounds: Normal heart sounds. No murmur heard.    No friction rub. No gallop.  Pulmonary:     Effort: Pulmonary effort is normal. No respiratory distress.     Breath sounds: Normal breath sounds. No wheezing or rales.  Chest:     Chest wall: No tenderness.  Abdominal:     General: Bowel sounds are normal. There is no distension.     Palpations: Abdomen is soft. There is no mass.      Tenderness: There is no abdominal tenderness. There is no guarding or rebound.  Genitourinary:    Penis: Normal. No tenderness.      Testes: Normal.     Prostate: Normal.     Rectum: Normal. Guaiac result negative.  Musculoskeletal:        General: No tenderness. Normal range of motion.     Cervical back: Neck supple.  Lymphadenopathy:     Cervical: No cervical adenopathy.  Skin:    General: Skin is warm and dry.     Coloration: Skin is not pale.     Findings: No erythema or rash.  Neurological:     General: No focal deficit present.     Mental Status: He is alert and oriented to person, place, and time.     Cranial Nerves: No cranial nerve deficit.     Motor: No abnormal muscle tone.     Coordination: Coordination normal.     Deep Tendon Reflexes: Reflexes are normal and symmetric. Reflexes normal.  Psychiatric:        Mood and Affect: Mood normal.        Behavior:  Behavior normal.        Thought Content: Thought content normal.        Judgment: Judgment normal.           Assessment & Plan:  His HTN seems to be well controlled off prescription medications. His OSA and allergies are stable. We will get fasting labs to check lipids, an A1c, etc. Per his request we will ordered another Cologuard test. We spent a total of (33   ) minutes reviewing records and discussing these issues.  Gershon Crane, MD

## 2022-12-03 ENCOUNTER — Other Ambulatory Visit: Payer: Self-pay

## 2022-12-03 ENCOUNTER — Telehealth: Payer: Self-pay | Admitting: Family Medicine

## 2022-12-03 MED ORDER — FEXOFENADINE-PSEUDOEPHED ER 180-240 MG PO TB24: 1.0000 | ORAL_TABLET | Freq: Every day | ORAL | 3 refills | Status: AC

## 2022-12-03 NOTE — Telephone Encounter (Signed)
Prescription Request  12/03/2022  LOV: 11/27/2022  What is the name of the medication or equipment?  fexofenadine-pseudoephedrine fexofenadine-pseudoephedrine (ALLEGRA-D ALLERGY & CONGESTION) 180-240 MG 24 hr tablet  methylPREDNISolone methylPREDNISolone (MEDROL DOSEPAK) 4 MG TBPK tablet   (Pt says they only have 5 mg Tbpk tablet)  Which pharmacy would you like this sent to?   [facility] - [place], [place] - [address] [facility] AT Portal to Reg[facility] [phone] One [address] [place] [address]         Patient notified that their request is being sent to the clinical staff for review and that they should receive a response within 2 business days.   Please advise at Mobile [phone] (mobile)

## 2022-12-03 NOTE — Telephone Encounter (Signed)
Allegra refill sent to pt pharmacy

## 2022-12-10 ENCOUNTER — Telehealth (INDEPENDENT_AMBULATORY_CARE_PROVIDER_SITE_OTHER): Payer: Medicare Other | Admitting: Family Medicine

## 2022-12-10 ENCOUNTER — Encounter: Payer: Self-pay | Admitting: Family Medicine

## 2022-12-10 DIAGNOSIS — E119 Type 2 diabetes mellitus without complications: Secondary | ICD-10-CM

## 2022-12-10 DIAGNOSIS — E782 Mixed hyperlipidemia: Secondary | ICD-10-CM

## 2022-12-10 LAB — COLOGUARD: COLOGUARD: POSITIVE — AB

## 2022-12-10 MED ORDER — ROSUVASTATIN CALCIUM 5 MG PO TABS: 5.0000 mg | ORAL_TABLET | Freq: Every day | ORAL | 3 refills | Status: DC

## 2022-12-10 MED ORDER — PREDNISONE 5 MG PO TABS: 5.0000 mg | ORAL_TABLET | Freq: Every day | ORAL | 3 refills | Status: AC

## 2022-12-10 NOTE — Progress Notes (Signed)
Subjective:    Patient ID: Alejandro Hill, male    DOB: 1955/08/11, 67 y.o.   MRN: [id number]  HPI Virtual Visit via Video Note  I connected with the patient on 12/10/22 at  8:30 AM EDT by a video enabled telemedicine application and verified that I am speaking with the correct person using two identifiers.  Location patient: home Location provider:work or home office Persons participating in the virtual visit: patient, provider  I discussed the limitations of evaluation and management by telemedicine and the availability of in person appointments. The patient expressed understanding and agreed to proceed.   HPI: Here to discuss his lab results from his well exam 2 weeks ago. His LDL was 127 and his A1c had jumped up to 7.7%. Per our discussion at the exam he has greatly reduced his intake of carbs and sugars, and he has lost 4 lbs. He feels fine. I advised him that he should start taking a statin because as a diabetic our goal should be to get the LDL down to 70.    ROS: See pertinent positives and negatives per HPI.  Past Medical History:  Diagnosis Date   Anemia    as a child   Asthma    exertional, seasonal (spring)   Diabetes mellitus    type II diabetes     Heart murmur    as a child   Hemorrhoids    Hyperlipidemia    Hypertension    Hypogonadism male    sees Dr. Retta Diones   Kidney stone    Migraine    cluster, [facility]   Pneumonia 2012   Seasonal allergies    Sleep apnea    , uses cpap   Thyroid disease    hypothyroidism - not taking medications except for [facility]   Umbilical hernia     Past Surgical History:  Procedure Laterality Date   CATARACT EXTRACTION Bilateral 01/30/2011   with lens implant   COLONOSCOPY  01/18/2019   Cologuard was negative   EXTRACORPOREAL SHOCK WAVE LITHOTRIPSY Left 03/26/2020   Procedure: EXTRACORPOREAL SHOCK WAVE LITHOTRIPSY (ESWL);  Surgeon: Crist Fat, MD;  Location: [facility];  Service: Urology;  Laterality: Left;   EYE SURGERY Bilateral    RK in his 32's, lasik when he was 43   HEMORRHOID SURGERY N/A 02/20/2014   Procedure: HEMORRHOIDECTOMY;  Surgeon: Chevis Pretty III, MD;  Location: [facility] OR;  Service: General;  Laterality: N/A;   HERNIA REPAIR  08/03/2007   left inguinal herniorrhaphy. Dr Carolynne Edouard   INSERTION OF MESH N/A 02/20/2014   Procedure: INSERTION OF MESH;  Surgeon: Chevis Pretty III, MD;  Location: [facility] OR;  Service: General;  Laterality: N/A;   TONSILLECTOMY     UMBILICAL HERNIA REPAIR  02/20/2014   & EXTERNAL HEMORRHOROIDS     DR TOTH   UMBILICAL HERNIA REPAIR N/A 02/20/2014   Procedure: HERNIA REPAIR UMBILICAL ADULT;  Surgeon: Chevis Pretty III, MD;  Location: [facility] OR;  Service: General;  Laterality: N/A;   UVULOPALATOPHARYNGOPLASTY     per Dr Haroldine Laws   VASECTOMY     WISDOM TOOTH EXTRACTION      Family History  Problem Relation Age of Onset   Heart disease Mother    Liver disease Father    Lung cancer Father    Heart disease Other    Hyperlipidemia Other    Stroke Other      Current Outpatient Medications:  ascorbic acid (VITAMIN C) 1000 MG tablet, Take 1,000 mg by mouth daily., Disp: , Rfl:    B Complex Vitamins (VITAMIN B COMPLEX PO), Take 1 tablet by mouth daily., Disp: , Rfl:    Cholecalciferol (VITAMIN D-3) 125 MCG (5000 UT) TABS, Take 1 tablet by mouth daily., Disp: , Rfl:    fexofenadine-pseudoephedrine (ALLEGRA-D ALLERGY & CONGESTION) 180-240 MG 24 hr tablet, Take 1 tablet by mouth daily., Disp: 90 tablet, Rfl: 3   ivermectin (STROMECTOL) 3 MG TABS tablet, Take 12 mg by mouth every Sunday., Disp: , Rfl:    KRILL OIL PO, Take 1 capsule by mouth daily., Disp: , Rfl:    Multiple Vitamin (MULTIVITAMIN) tablet, Take 1 tablet by mouth daily., Disp: , Rfl:    NON FORMULARY, 5 mcg daily. Sea Kelp, Disp: , Rfl:    OVER THE COUNTER MEDICATION, Take 2 tablets by mouth daily. Triple strength curimin supplement, Disp: , Rfl:    Red Yeast Rice Extract  (RED YEAST RICE PO), Take 1 capsule by mouth 2 (two) times daily. , Disp: , Rfl:    methylPREDNISolone (MEDROL DOSEPAK) 4 MG TBPK tablet, As directed (Patient not taking: Reported on 12/10/2022), Disp: 21 tablet, Rfl: 0  EXAM:  VITALS per patient if applicable:  GENERAL: alert, oriented, appears well and in no acute distress  HEENT: atraumatic, conjunttiva clear, no obvious abnormalities on inspection of external nose and ears  NECK: normal movements of the head and neck  LUNGS: on inspection no signs of respiratory distress, breathing rate appears normal, no obvious gross SOB, gasping or wheezing  CV: no obvious cyanosis  MS: moves all visible extremities without noticeable abnormality  PSYCH/NEURO: pleasant and cooperative, no obvious depression or anxiety, speech and thought processing grossly intact  ASSESSMENT AND PLAN: His diabetes is poorly controlled, and he is not interested in getting on any medication for this. Instead he will watch a strict diet as above. For the high cholesterol, he agreed to start on Rosuvastatin 5 mg daily. We will recheck these labs in 90 days.  Gershon Crane, MD   Discussed the following assessment and plan:  No diagnosis found.     I discussed the assessment and treatment plan with the patient. The patient was provided an opportunity to ask questions and all were answered. The patient agreed with the plan and demonstrated an understanding of the instructions.   The patient was advised to call back or seek an in-person evaluation if the symptoms worsen or if the condition fails to improve as anticipated.      Review of Systems     Objective:   Physical Exam        Assessment & Plan:

## 2022-12-12 ENCOUNTER — Telehealth: Payer: Self-pay | Admitting: Family Medicine

## 2022-12-12 NOTE — Telephone Encounter (Signed)
Prescription Request  12/12/2022  LOV: 11/27/2022  What is the name of the medication or equipment? rosuvastatin (CRESTOR) 5 MG tablet  Pt called to say Alejandro Hill threw out his Rx, so please send refill to [place].  Have you contacted your pharmacy to request a refill? Yes   Which pharmacy would you like this sent to?   [facility] [place] [place] - [address] [place] [address] Phone: [phone] Fax: [phone]   Patient notified that their request is being sent to the clinical staff for review and that they should receive a response within 2 business days.   Please advise at Mobile [phone] (mobile)                            [id number] [place], [place] - [address] AT [employer] [facility]  Phone: [phone] Fax: [phone]

## 2022-12-15 ENCOUNTER — Other Ambulatory Visit: Payer: Self-pay

## 2022-12-15 MED ORDER — ROSUVASTATIN CALCIUM 5 MG PO TABS: 5.0000 mg | ORAL_TABLET | Freq: Every day | ORAL | 3 refills | Status: AC

## 2022-12-15 NOTE — Telephone Encounter (Signed)
Done

## 2023-01-15 ENCOUNTER — Telehealth: Payer: Self-pay | Admitting: Family Medicine

## 2023-01-15 DIAGNOSIS — R195 Other fecal abnormalities: Secondary | ICD-10-CM

## 2023-01-15 NOTE — Telephone Encounter (Signed)
Pt is still waiting for GI referral please put new GI referral in for positive cologuard

## 2023-01-16 NOTE — Telephone Encounter (Signed)
FYI Referral to GI placed for Colonoscopy, pt advised that he will be getting a call from GI office to schedule, verbalized understanding

## 2023-01-19 ENCOUNTER — Encounter: Payer: Self-pay | Admitting: Gastroenterology

## 2023-02-04 ENCOUNTER — Encounter: Payer: Self-pay | Admitting: Gastroenterology

## 2023-02-04 ENCOUNTER — Ambulatory Visit (INDEPENDENT_AMBULATORY_CARE_PROVIDER_SITE_OTHER): Payer: Medicare Other | Admitting: Gastroenterology

## 2023-02-04 VITALS — BP 162/76 | HR 70 | Ht 66.0 in | Wt 232.0 lb

## 2023-02-04 DIAGNOSIS — R195 Other fecal abnormalities: Secondary | ICD-10-CM

## 2023-02-04 DIAGNOSIS — Z1211 Encounter for screening for malignant neoplasm of colon: Secondary | ICD-10-CM

## 2023-02-04 MED ORDER — NA SULFATE-K SULFATE-MG SULF 17.5-3.13-1.6 GM/177ML PO SOLN: 1.0000 | Freq: Once | ORAL | 0 refills | Status: AC

## 2023-02-04 NOTE — Progress Notes (Signed)
[facility] Consult Note:  History: Alejandro Hill 02/04/2023  Referring provider: Nelwyn Salisbury, MD  Reason for consult/chief complaint: Colon Cancer Screening (Patient prefers sigmoidoscopy instead of colon, history of hemorrhoid surgery)   Subjective  Prior history: Screening colonoscopy with Dr. Arlyce Dice November 2010.  Complete exam, good prep, diminutive ascending colon hyperplastic polyp. He had positive Cologuard studies in October 2020 and September 2024    Discussed the use of AI scribe software for clinical note transcription with the patient, who gave verbal consent to proceed.  History of Present Illness   The patient, with a history of hemorrhoids and a previous colonoscopy in 2010, presents for consultation following two consecutive positive Cologuard tests. The first positive result was attributed to the presence of hemorrhoids, which were surgically treated prior to 2008. The patient has feelings of intermittent "seepage" that he attributes to the hemorrhoids, though no bleeding.Marland Kitchen He expresses concern about the invasiveness of a colonoscopy and suggests a sigmoidoscopy as a less invasive alternative.  Conversations with friends and family and additional reading have given him concerns about the risk of perforation from a full colonoscopy.  The patient denies any changes in bowel habits, weight loss, stomach pain, or difficulty swallowing. He describes his bowel movements as regular, sometimes occurring two to three times a day. He reports no alarming changes in his digestive symptoms and feels generally healthy.  He denies dysphagia, odynophagia, nausea, vomiting, early satiety or unexpected weight loss.  The patient also mentions a busy lifestyle, including a 60-hour work week and responsibilities at home caring for family members.  Current medicines were reviewed, clarifying those which he was and was not still taking.  He reports taking various  medications and supplements, including Ivermectin as a preventative measure against COVID-19 due to his inability to receive the vaccine. He also mentions a history of severe reactions to poison ivy and poison oak, keeping a prescription of prednisone on hand as a precaution.       ROS:  Review of Systems  Constitutional:  Negative for appetite change and unexpected weight change.  HENT:  Negative for mouth sores and voice change.   Eyes:  Negative for pain and redness.  Respiratory:  Negative for cough and shortness of breath.   Cardiovascular:  Negative for chest pain and palpitations.  Genitourinary:  Negative for dysuria and hematuria.  Musculoskeletal:  Negative for arthralgias and myalgias.  Skin:  Negative for pallor and rash.  Neurological:  Negative for weakness and headaches.  Hematological:  Negative for adenopathy.     Past Medical History: Past Medical History:  Diagnosis Date   Anemia    as a child   Asthma    exertional, seasonal (spring)   Diabetes mellitus    type II diabetes     Heart murmur    as a child   Hemorrhoids    Hyperlipidemia    Hypertension    Hypogonadism male    sees Dr. Retta Diones   Kidney stone    Migraine    cluster, [facility]   Pneumonia 2012   Seasonal allergies    Sleep apnea    , uses cpap   Thyroid disease    hypothyroidism - not taking medications except for [facility]   Umbilical hernia      Past Surgical History: Past Surgical History:  Procedure Laterality Date   CATARACT EXTRACTION Bilateral 01/30/2011   with lens implant   COLONOSCOPY  01/18/2019  Cologuard was negative   EXTRACORPOREAL SHOCK WAVE LITHOTRIPSY Left 03/26/2020   Procedure: EXTRACORPOREAL SHOCK WAVE LITHOTRIPSY (ESWL);  Surgeon: Crist Fat, MD;  Location: [facility];  Service: Urology;  Laterality: Left;   EYE SURGERY Bilateral    RK in his 47's, lasik when he was 56   HEMORRHOID SURGERY N/A 02/20/2014    Procedure: HEMORRHOIDECTOMY;  Surgeon: Chevis Pretty III, MD;  Location: [facility] OR;  Service: General;  Laterality: N/A;   HERNIA REPAIR  08/03/2007   left inguinal herniorrhaphy. Dr Carolynne Edouard   INSERTION OF MESH N/A 02/20/2014   Procedure: INSERTION OF MESH;  Surgeon: Chevis Pretty III, MD;  Location: [facility] OR;  Service: General;  Laterality: N/A;   TONSILLECTOMY     UMBILICAL HERNIA REPAIR  02/20/2014   & EXTERNAL HEMORRHOROIDS     DR TOTH   UMBILICAL HERNIA REPAIR N/A 02/20/2014   Procedure: HERNIA REPAIR UMBILICAL ADULT;  Surgeon: Chevis Pretty III, MD;  Location: [facility] OR;  Service: General;  Laterality: N/A;   UVULOPALATOPHARYNGOPLASTY     per Dr Haroldine Laws   VASECTOMY     WISDOM TOOTH EXTRACTION       Family History: Family History  Problem Relation Age of Onset   Heart disease Mother    Liver disease Father    Lung cancer Father    Heart disease Other    Hyperlipidemia Other    Stroke Other     Social History: Social History   Socioeconomic History   Marital status: Married    Spouse name: Not on file   Number of children: Not on file   Years of education: Not on file   Highest education level: Not on file  Occupational History   Occupation: self employed  Tobacco Use   Smoking status: Never   Smokeless tobacco: Never  Substance and Sexual Activity   Alcohol use: Yes    Alcohol/week: 0.0 standard drinks of alcohol    Comment: 1-2 drink per week   Drug use: No   Sexual activity: Not on file  Other Topics Concern   Not on file  Social History Narrative   Regular Exercise:  Yes   Religion affecting care (Jehovah"s Witness) so will not accept blood products.   Social Determinants of Health   Financial Resource Strain: Low Risk  (10/15/2022)   Overall Financial Resource Strain (CARDIA)    Difficulty of Paying Living Expenses: Not hard at all  Food Insecurity: No Food Insecurity (10/15/2022)   Hunger Vital Sign    Worried About Running Out of Food in the Last Year: Never true     Ran Out of Food in the Last Year: Never true  Transportation Needs: No Transportation Needs (10/15/2022)   PRAPARE - Administrator, Civil Service (Medical): No    Lack of Transportation (Non-Medical): No  Physical Activity: Inactive (10/15/2022)   Exercise Vital Sign    Days of Exercise per Week: 0 days    Minutes of Exercise per Session: 0 min  Stress: Stress Concern Present (10/15/2022)   [employer] of Occupational Health - Occupational Stress Questionnaire    Feeling of Stress : To some extent  Social Connections: Socially Integrated (10/15/2022)   Social Connection and Isolation Panel [NHANES]    Frequency of Communication with Friends and Family: More than three times a week    Frequency of Social Gatherings with Friends and Family: More than three times a week    Attends Religious Services: More  than 4 times per year    Active Member of Clubs or Organizations: Yes    Attends Banker Meetings: More than 4 times per year    Marital Status: Married   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 Reports social stressors at home with multiple living family members struggling with mental illness and a granddaughter with diabetes.  Allergies: Allergies  Allergen Reactions   Cefuroxime Axetil Other (See Comments)    headaches   Codeine Nausea And Vomiting   Erythromycin Other (See Comments)    headaches   Morphine And Codeine Other (See Comments)    Tight stool   Ppd [Tuberculin Purified Protein Derivative]     other   Sulfa Antibiotics Hives   Norco [Hydrocodone-Acetaminophen] Rash and Other (See Comments)    hallucinations   Penicillins Rash    Outpatient Meds: Current Outpatient Medications  Medication Sig Dispense Refill   ascorbic acid (VITAMIN C) 1000 MG tablet Take 1,000 mg by mouth daily.     B Complex Vitamins (VITAMIN B COMPLEX PO) Take 1 tablet by mouth daily.     Cholecalciferol (VITAMIN D-3) 125 MCG (5000 UT) TABS Take 1 tablet by  mouth daily.     co-enzyme Q-10 30 MG capsule Take 30 mg by mouth daily.     fexofenadine-pseudoephedrine (ALLEGRA-D ALLERGY & CONGESTION) 180-240 MG 24 hr tablet Take 1 tablet by mouth daily. 90 tablet 3   ivermectin (STROMECTOL) 3 MG TABS tablet Take 12 mg by mouth every Sunday.     KRILL OIL PO Take 1 capsule by mouth daily.     Multiple Vitamin (MULTIVITAMIN) tablet Take 1 tablet by mouth daily.     Na Sulfate-K Sulfate-Mg Sulf 17.5-3.13-1.6 GM/177ML SOLN Take 1 kit by mouth once for 1 dose. 354 mL 0   NON FORMULARY 5 mcg daily. Sea Kelp     OVER THE COUNTER MEDICATION Take 2 tablets by mouth daily. Triple strength curimin supplement     predniSONE (DELTASONE) 5 MG tablet Take 1 tablet (5 mg total) by mouth daily with breakfast. 90 tablet 3   Red Yeast Rice Extract (RED YEAST RICE PO) Take 1 capsule by mouth 2 (two) times daily.      rosuvastatin (CRESTOR) 5 MG tablet Take 1 tablet (5 mg total) by mouth daily. 90 tablet 3   vitamin k 100 MCG tablet Take 100 mcg by mouth daily.     No current facility-administered medications for this visit.      ___________________________________________________________________ Objective   Exam:  BP (!) 162/76   Pulse 70   Ht 5\' 6"  (1.676 m)   Wt 232 lb (105.2 kg)   BMI 37.45 kg/m  Wt Readings from Last 3 Encounters:  02/04/23 232 lb (105.2 kg)  11/27/22 236 lb (107 kg)  10/15/22 218 lb (98.9 kg)    General: Well-appearing, pleasant and conversational.  Gets on exam table without difficulty Eyes: sclera anicteric, no redness ENT: oral mucosa moist without lesions, no cervical or supraclavicular lymphadenopathy CV: Regular without appreciable murmur, no JVD, no peripheral edema Resp: clear to auscultation bilaterally, normal RR and effort noted GI: soft, obese, no tenderness, with active bowel sounds. No guarding or palpable organomegaly noted.  (Limited by body habitus).  Scar from an umbilical hernia repair.  Upper abdominal rectus  diastasis Skin; warm and dry, no rash or jaundice noted Neuro: awake, alert and oriented x 3. Normal gross motor function and fluent speech   Labs:  Latest Ref Rng & Units 11/27/2022   10:23 AM 10/19/2020   11:41 AM 03/13/2020    2:38 PM  CBC  WBC 4.0 - 10.5 K/uL 5.8  6.1  12.6   Hemoglobin 13.0 - 17.0 g/dL 08.6  57.8  46.9   Hematocrit 39.0 - 52.0 % 45.1  48.0  44.9   Platelets 150.0 - 400.0 K/uL 185.0  219.0  233       Latest Ref Rng & Units 11/27/2022   10:23 AM 10/19/2020   11:41 AM 03/13/2020    2:38 PM  CMP  Glucose 70 - 99 mg/dL 629  98  528   BUN 6 - 23 mg/dL 14  19  25    Creatinine 0.40 - 1.50 mg/dL 4.13  2.44  0.10   Sodium 135 - 145 mEq/L 140  138  138   Potassium 3.5 - 5.1 mEq/L 3.7  3.9  3.7   Chloride 96 - 112 mEq/L 102  102  98   CO2 19 - 32 mEq/L 29  28  25    Calcium 8.4 - 10.5 mg/dL 8.7  9.0  8.9   Total Protein 6.0 - 8.3 g/dL 6.6  7.1  6.8   Total Bilirubin 0.2 - 1.2 mg/dL 0.9  0.9  1.1   Alkaline Phos 39 - 117 U/L 81  70  78   AST 0 - 37 U/L 23  16  13    ALT 0 - 53 U/L 30  15  16      Assessment: Encounter Diagnosis  Name Primary?   Positive colorectal cancer screening using Cologuard test Yes    Assessment and Plan    Positive Cologuard Test Two positive Cologuard tests over several years. Last colonoscopy in 2010 with removal of a non-precancerous polyp. Patient has internal hemorrhoids but no active bleeding. No changes in bowel habits, weight loss, or abdominal pain. Discussed the need for a full colonoscopy given the positive Cologuard tests and the limitations of sigmoidoscopy in fully evaluating the colon.  I listened to 2, recognizing completely understand his concerns regarding the risks of endoscopic procedures.  My opinion is that a full colonoscopy has only marginally greater risk of perforation then a sigmoidoscopy, similar risks of post polypectomy bleeding and anesthesia. In addition, sigmoidoscopy is unquestionably and insufficient  examination of the colon in the setting of a positive Cologuard.  I explained how positive Cologuard occurs most commonly from 1 or more precancerous polyps, less commonly a nonpolyp source of occult GI blood loss, false positive test and colon cancer. After that full discussion and answering some questions, he was agreeable to a colonoscopy. -Schedule full colonoscopy. Patient to arrange for a care partner for transportation post-procedure.   The benefits and risks of the planned procedure were described in detail with the patient or (when appropriate) their health care proxy.  Risks were outlined as including, but not limited to, bleeding, infection, perforation, adverse medication reaction leading to cardiac or pulmonary decompensation, pancreatitis (if ERCP).  The limitation of incomplete mucosal visualization was also discussed.  No guarantees or warranties were given.   40 minutes were spent on this encounter (including chart review, history/exam, counseling/coordination of care, and documentation) > 50% of that time was spent on counseling and coordination of care.   Thank you for the courtesy of this consult.  Please call me with any questions or concerns.  Charlie Pitter III  CC: Referring provider noted above

## 2023-02-04 NOTE — Patient Instructions (Signed)
You have been scheduled for a colonoscopy. Please follow written instructions given to you at your visit today.   Please pick up your prep supplies at the pharmacy within the next 1-3 days.  If you use inhalers (even only as needed), please bring them with you on the day of your procedure.  DO NOT TAKE 7 DAYS PRIOR TO TEST- Trulicity (dulaglutide) Ozempic, Wegovy (semaglutide) Mounjaro (tirzepatide) Bydureon Bcise (exanatide extended release)  DO NOT TAKE 1 DAY PRIOR TO YOUR TEST Rybelsus (semaglutide) Adlyxin (lixisenatide) Victoza (liraglutide) Byetta (exanatide) ___________________________________________________________________________   _______________________________________________________  If your blood pressure at your visit was 140/90 or greater, please contact your primary care physician to follow up on this.  _______________________________________________________  If you are age 41 or older, your body mass index should be between 23-30. Your Body mass index is 37.45 kg/m. If this is out of the aforementioned range listed, please consider follow up with your Primary Care Provider.  If you are age 51 or younger, your body mass index should be between 19-25. Your Body mass index is 37.45 kg/m. If this is out of the aformentioned range listed, please consider follow up with your Primary Care Provider.   ________________________________________________________  The [facility] providers would like to encourage you to use [facility] to communicate with providers for non-urgent requests or questions.  Due to long hold times on the telephone, sending your provider a message by [facility] may be a faster and more efficient way to get a response.  Please allow 48 business hours for a response.  Please remember that this is for non-urgent requests.  _______________________________________________________ It was a pleasure to see you today!  Thank you for trusting me with your  gastrointestinal care!

## 2023-02-09 ENCOUNTER — Encounter: Payer: Medicare Other | Admitting: Gastroenterology

## 2023-02-11 ENCOUNTER — Encounter: Payer: Self-pay | Admitting: Gastroenterology

## 2023-02-11 ENCOUNTER — Ambulatory Visit: Payer: Medicare Other | Admitting: Gastroenterology

## 2023-02-11 VITALS — BP 167/73 | HR 51 | Temp 97.5°F | Resp 15 | Ht 66.0 in | Wt 232.0 lb

## 2023-02-11 DIAGNOSIS — K635 Polyp of colon: Secondary | ICD-10-CM

## 2023-02-11 DIAGNOSIS — Z1211 Encounter for screening for malignant neoplasm of colon: Secondary | ICD-10-CM

## 2023-02-11 DIAGNOSIS — D123 Benign neoplasm of transverse colon: Secondary | ICD-10-CM

## 2023-02-11 DIAGNOSIS — D122 Benign neoplasm of ascending colon: Secondary | ICD-10-CM | POA: Diagnosis not present

## 2023-02-11 DIAGNOSIS — R195 Other fecal abnormalities: Secondary | ICD-10-CM | POA: Diagnosis not present

## 2023-02-11 DIAGNOSIS — D120 Benign neoplasm of cecum: Secondary | ICD-10-CM

## 2023-02-11 MED ORDER — SODIUM CHLORIDE 0.9 % IV SOLN: 500.0000 mL | Freq: Once | INTRAVENOUS | Status: DC

## 2023-02-11 NOTE — Progress Notes (Signed)
To pacu, VSS. Report to [email or web address] 

## 2023-02-11 NOTE — Patient Instructions (Signed)

## 2023-02-11 NOTE — Op Note (Signed)
[facility] Patient Name: Alejandro Hill Procedure Date: 02/11/2023 7:32 AM MRN: [id number] Endoscopist: Sherilyn Cooter L. Myrtie Neither , MD, [id number] Age: 67 Referring MD:  Date of Birth: 06-Jul-1955 Gender: Male Account #: [id number] Procedure:                Colonoscopy Indications:              Positive Cologuard test Medicines:                Monitored Anesthesia Care Procedure:                Pre-Anesthesia Assessment:                           - Prior to the procedure, a History and Physical                            was performed, and patient medications and                            allergies were reviewed. The patient's tolerance of                            previous anesthesia was also reviewed. The risks                            and benefits of the procedure and the sedation                            options and risks were discussed with the patient.                            All questions were answered, and informed consent                            was obtained. Prior Anticoagulants: The patient has                            taken no anticoagulant or antiplatelet agents. ASA                            Grade Assessment: II - A patient with mild systemic                            disease. After reviewing the risks and benefits,                            the patient was deemed in satisfactory condition to                            undergo the procedure.                           After obtaining informed consent, the colonoscope  was passed under direct vision. Throughout the                            procedure, the patient's blood pressure, pulse, and                            oxygen saturations were monitored continuously. The                            Olympus Scope [id number] was introduced through the                            anus and advanced to the the terminal ileum, with                            identification of the appendiceal  orifice and IC                            valve. The colonoscopy was performed without                            difficulty. The patient tolerated the procedure                            well. The quality of the bowel preparation was good                            after lavage of dark release.. The terminal ileum,                            ileocecal valve, appendiceal orifice, and rectum                            were photographed. The bowel preparation used was                            SUPREP via split dose instruction. Scope In: 8:03:27 AM Scope Out: 8:23:19 AM Scope Withdrawal Time: 0 hours 17 minutes 21 seconds  Total Procedure Duration: 0 hours 19 minutes 52 seconds  Findings:                 The perianal and digital rectal examinations were                            normal.                           The terminal ileum appeared normal.                           Repeat examination of right colon under NBI                            performed.  A 12 mm polyp was found in the cecum. The polyp was                            mucous-capped and semi-sessile. The polyp was                            removed with a piecemeal technique using a cold                            snare. Resection and retrieval were complete. ( Jar                            1)                           Three sessile polyps were found in the transverse                            colon and ascending colon. The polyps were 6 to 10                            mm in size. These polyps were removed with a cold                            snare. Resection and retrieval were complete. (Jar                            2 )                           A 6 mm polyp was found in the transverse colon. The                            polyp was semi-sessile. The polyp was removed with                            a cold snare. Resection and retrieval were                            complete. (Jar 3)                            Multiple diverticula were found in the left colon.                           Internal hemorrhoids were found. The hemorrhoids                            were Grade I (internal hemorrhoids that do not                            prolapse).  The exam was otherwise without abnormality on                            direct and retroflexion views. Complications:            No immediate complications. Estimated Blood Loss:     Estimated blood loss was minimal. Impression:               - The examined portion of the ileum was normal.                           - One 12 mm polyp in the cecum, removed piecemeal                            using a cold snare. Resected and retrieved.                           - Three 6 to 10 mm polyps in the transverse colon                            and in the ascending colon, removed with a cold                            snare. Resected and retrieved.                           - One 6 mm polyp in the transverse colon, removed                            with a cold snare. Resected and retrieved.                           - Diverticulosis in the left colon.                           - Internal hemorrhoids.                           - The examination was otherwise normal on direct                            and retroflexion views. Recommendation:           - Patient has a contact number available for                            emergencies. The signs and symptoms of potential                            delayed complications were discussed with the                            patient. Return to normal activities tomorrow.  Written discharge instructions were provided to the                            patient.                           - Resume previous diet.                           - Continue present medications.                           - Await pathology results.                           -  Repeat colonoscopy is recommended for                            surveillance. The colonoscopy date will be                            determined after pathology results from today's                            exam become available for review. For next                            colonoscopy, metoclopramide premedication consume                            more water with prep. Shanoah Asbill L. Myrtie Neither, MD 02/11/2023 8:30:21 AM This report has been signed electronically.

## 2023-02-11 NOTE — Progress Notes (Signed)
181/78- rechecked per [facility].  [facility] made aware

## 2023-02-11 NOTE — Progress Notes (Signed)
No significant changes to clinical history since GI office visit on 02/04/23.  The patient is appropriate for an endoscopic procedure in the ambulatory setting.  - Amada Jupiter, MD

## 2023-02-11 NOTE — Progress Notes (Signed)
Called to room to assist during endoscopic procedure.  Patient ID and intended procedure confirmed with present staff. Received instructions for my participation in the procedure from the performing physician.  

## 2023-02-12 ENCOUNTER — Telehealth: Payer: Self-pay | Admitting: *Deleted

## 2023-02-12 NOTE — Telephone Encounter (Signed)
  Follow up Call-     02/11/2023    7:18 AM  Call back number  Post procedure Call Back phone  # [phone]  Permission to leave phone message Yes     Patient questions:  Do you have a fever, pain , or abdominal swelling? No. Pain Score  0 *  Have you tolerated food without any problems? Yes.    Have you been able to return to your normal activities? Yes.    Do you have any questions about your discharge instructions: Diet   No. Medications  No. Follow up visit  No.  Do you have questions or concerns about your Care? No.  Actions: * If pain score is 4 or above: No action needed, pain <4.

## 2023-02-13 LAB — SURGICAL PATHOLOGY

## 2023-02-17 ENCOUNTER — Encounter: Payer: Self-pay | Admitting: Gastroenterology

## 2023-08-03 ENCOUNTER — Emergency Department

## 2023-08-03 ENCOUNTER — Other Ambulatory Visit: Payer: Self-pay

## 2023-08-03 ENCOUNTER — Encounter: Payer: Self-pay | Admitting: Emergency Medicine

## 2023-08-03 ENCOUNTER — Emergency Department
Admission: EM | Admit: 2023-08-03 | Discharge: 2023-08-04 | Disposition: A | Discharge status: Other Acute Inpatient Hospital | Attending: Emergency Medicine | Admitting: Emergency Medicine

## 2023-08-03 DIAGNOSIS — J45909 Unspecified asthma, uncomplicated: Secondary | ICD-10-CM | POA: Insufficient documentation

## 2023-08-03 DIAGNOSIS — I10 Essential (primary) hypertension: Secondary | ICD-10-CM | POA: Diagnosis not present

## 2023-08-03 DIAGNOSIS — F22 Delusional disorders: Secondary | ICD-10-CM | POA: Insufficient documentation

## 2023-08-03 DIAGNOSIS — E119 Type 2 diabetes mellitus without complications: Secondary | ICD-10-CM | POA: Insufficient documentation

## 2023-08-03 DIAGNOSIS — E039 Hypothyroidism, unspecified: Secondary | ICD-10-CM | POA: Diagnosis not present

## 2023-08-03 DIAGNOSIS — W108XXA Fall (on) (from) other stairs and steps, initial encounter: Secondary | ICD-10-CM | POA: Insufficient documentation

## 2023-08-03 DIAGNOSIS — M79604 Pain in right leg: Secondary | ICD-10-CM | POA: Insufficient documentation

## 2023-08-03 DIAGNOSIS — R4182 Altered mental status, unspecified: Secondary | ICD-10-CM | POA: Diagnosis not present

## 2023-08-03 LAB — COMPREHENSIVE METABOLIC PANEL WITH GFR
ALT: 36 U/L (ref 0–44)
AST: 30 U/L (ref 15–41)
Albumin: 4.2 g/dL (ref 3.5–5.0)
Alkaline Phosphatase: 62 U/L (ref 38–126)
Anion gap: 9 (ref 5–15)
BUN: 30 mg/dL — ABNORMAL HIGH (ref 8–23)
CO2: 21 mmol/L — ABNORMAL LOW (ref 22–32)
Calcium: 9 mg/dL (ref 8.9–10.3)
Chloride: 105 mmol/L (ref 98–111)
Creatinine, Ser: 0.77 mg/dL (ref 0.61–1.24)
GFR, Estimated: >60 mL/min (ref >60)
Glucose, Bld: 179 mg/dL — ABNORMAL HIGH (ref 70–99)
Potassium: 3.8 mmol/L (ref 3.5–5.1)
Sodium: 135 mmol/L (ref 135–145)
Total Bilirubin: 0.8 mg/dL (ref 0.0–1.2)
Total Protein: 6.8 g/dL (ref 6.5–8.1)

## 2023-08-03 LAB — CBC WITH DIFFERENTIAL/PLATELET
Abs Immature Granulocytes: 0.08 10*3/uL — ABNORMAL HIGH (ref 0.00–0.07)
Basophils Absolute: 0.1 10*3/uL (ref 0.0–0.1)
Basophils Relative: 0 %
Eosinophils Absolute: 0 10*3/uL (ref 0.0–0.5)
Eosinophils Relative: 0 %
HCT: 42.7 % (ref 39.0–52.0)
Hemoglobin: 14.2 g/dL (ref 13.0–17.0)
Immature Granulocytes: 1 %
Lymphocytes Relative: 6 %
Lymphs Abs: 0.8 10*3/uL (ref 0.7–4.0)
MCH: 28.7 pg (ref 26.0–34.0)
MCHC: 33.3 g/dL (ref 30.0–36.0)
MCV: 86.4 fL (ref 80.0–100.0)
Monocytes Absolute: 0.6 10*3/uL (ref 0.1–1.0)
Monocytes Relative: 5 %
Neutro Abs: 12 10*3/uL — ABNORMAL HIGH (ref 1.7–7.7)
Neutrophils Relative %: 88 %
Platelets: 866 10*3/uL — ABNORMAL HIGH (ref 150–400)
RBC: 4.94 MIL/uL (ref 4.22–5.81)
RDW: 12.5 % (ref 11.5–15.5)
WBC: 13.6 10*3/uL — ABNORMAL HIGH (ref 4.0–10.5)
nRBC: 0 % (ref 0.0–0.2)

## 2023-08-03 MED ORDER — IBUPROFEN 600 MG PO TABS: 600.0000 mg | ORAL_TABLET | Freq: Three times a day (TID) | ORAL | Status: DC | PRN

## 2023-08-03 MED ORDER — ALUM & MAG HYDROXIDE-SIMETH 200-200-20 MG/5ML PO SUSP: 30.0000 mL | Freq: Four times a day (QID) | ORAL | Status: DC | PRN

## 2023-08-03 MED ORDER — ONDANSETRON HCL 4 MG PO TABS: 4.0000 mg | ORAL_TABLET | Freq: Three times a day (TID) | ORAL | Status: DC | PRN

## 2023-08-03 MED ORDER — OXYCODONE-ACETAMINOPHEN 5-325 MG PO TABS
1.0000 | ORAL_TABLET | Freq: Once | ORAL | Status: AC
  Administered 2023-08-03: 1 via ORAL
  Filled 2023-08-03: qty 1

## 2023-08-03 MED ORDER — NAPROXEN 500 MG PO TABS
500.0000 mg | ORAL_TABLET | Freq: Once | ORAL | Status: AC
  Administered 2023-08-03: 500 mg via ORAL
  Filled 2023-08-03: qty 1

## 2023-08-03 MED ORDER — HYDROMORPHONE HCL 1 MG/ML IJ SOLN
1.0000 mg | Freq: Once | INTRAMUSCULAR | Status: AC
  Administered 2023-08-03: 1 mg via INTRAMUSCULAR
  Filled 2023-08-03: qty 1

## 2023-08-03 NOTE — ED Notes (Addendum)
 Pt belongings:  Pair of grey shoes, pair of white socks, grey shirt, brown bracelet, green shorts, blue underwear, brown belt, phone, walletx3, glasses case, $68

## 2023-08-03 NOTE — ED Notes (Signed)
Patient given cranberry juice

## 2023-08-03 NOTE — ED Provider Notes (Addendum)
 [facility] Provider Note    Event Date/Time   First MD Initiated Contact with Patient 08/03/23 [phone]     (approximate)   History   Chief Complaint: Fall   HPI  Alejandro Hill is a 68 y.o. male with a past history of diabetes, hypothyroidism, anxiety who is brought to the ED by his wife.  He did trip and fall off of the bottom step of a set of stairs, landing on his right leg and having pain in that area.  He complains of pain diffusely from the hip down to the right ankle, worse with movement.  He denies any other concerns.  Wife also notes that they have been worried about his mental health recently.  He has become increasingly paranoid recently, is worried that the family is talking about him.  He is installing security cameras throughout the house to monitor his family.  He questions their grandson about what the family is saying about him.  He has been hoarding and is very secretive about what objects he is saving.  Wife also reports that when she goes on the main level of the house, he shouts at her to go back to the basement level, and becomes aggressive and forceful.  She is worried about safety because he has a firearm which he will not allow anyone to take.        Past Medical History:  Diagnosis Date   Allergy    Anemia    as a child   Asthma    exertional, seasonal (spring)   Cataract    Diabetes mellitus    type II diabetes     Heart murmur    as a child   Hemorrhoids    Hyperlipidemia    Hypertension    Hypogonadism male    sees Dr. Joie Narrow   Kidney stone    Migraine    cluster, [facility]   Pneumonia 2012   Seasonal allergies    Sleep apnea    , uses cpap- no longer used 02-11-23   Thyroid  disease    hypothyroidism - not taking medications except for [facility]   Umbilical hernia     Current Outpatient Rx   Order #: [id number] Class: Historical Med   Order #: [id number] Class: Historical Med   Order #:  [id number] Class: Historical Med   Order #: [id number] Class: Historical Med   Order #: [id number] Class: Normal   Order #: [id number] Class: Historical Med   Order #: [id number] Class: Historical Med   Order #: [id number] Class: Historical Med   Order #: [id number] Class: Historical Med   Order #: [id number] Class: Historical Med   Order #: [id number] Class: Historical Med   Order #: [id number] Class: Normal   Order #: [id number] Class: Historical Med   Order #: [id number] Class: Normal   Order #: [id number] Class: Historical Med    Past Surgical History:  Procedure Laterality Date   CATARACT EXTRACTION Bilateral 01/30/2011   with lens implant   COLONOSCOPY  01/18/2019   Cologuard was negative   EXTRACORPOREAL SHOCK WAVE LITHOTRIPSY Left 03/26/2020   Procedure: EXTRACORPOREAL SHOCK WAVE LITHOTRIPSY (ESWL);  Surgeon: Andrez Banker, MD;  Location: [facility];  Service: Urology;  Laterality: Left;   EYE SURGERY Bilateral    RK in his 69's, lasik when he was 21   HEMORRHOID SURGERY N/A 02/20/2014   Procedure: HEMORRHOIDECTOMY;  Surgeon: Lillette Reid III, MD;  Location: [facility] OR;  Service: General;  Laterality: N/A;   HERNIA REPAIR  08/03/2007   left inguinal herniorrhaphy. Dr Alethea Andes   INSERTION OF MESH N/A 02/20/2014   Procedure: INSERTION OF MESH;  Surgeon: Lillette Reid III, MD;  Location: [facility] OR;  Service: General;  Laterality: N/A;   TONSILLECTOMY     UMBILICAL HERNIA REPAIR  02/20/2014   & EXTERNAL HEMORRHOROIDS     DR TOTH   UMBILICAL HERNIA REPAIR N/A 02/20/2014   Procedure: HERNIA REPAIR UMBILICAL ADULT;  Surgeon: Lillette Reid III, MD;  Location: [facility] OR;  Service: General;  Laterality: N/A;   UVULOPALATOPHARYNGOPLASTY     per Dr Carolann Chum     WISDOM TOOTH EXTRACTION      Physical Exam   Triage Vital Signs: ED Triage Vitals [08/03/23 1552]  Encounter Vitals Group     BP (!) 141/83     Systolic BP Percentile      Diastolic BP Percentile      Pulse Rate 76     Resp 17      Temp (!) 97.5 F (36.4 C)     Temp Source Oral     SpO2 97 %     Weight 220 lb (99.8 kg)     Height 5\' 6"  (1.676 m)     Head Circumference      Peak Flow      Pain Score 0     Pain Loc      Pain Education      Exclude from Growth Chart     Most recent vital signs: Vitals:   08/03/23 1552 08/03/23 2009  BP: (!) 141/83 (!) 182/80  Pulse: 76 67  Resp: 17 16  Temp: (!) 97.5 F (36.4 C) 98.2 F (36.8 C)  SpO2: 97% 97%    General: Awake, no distress.  CV:  Good peripheral perfusion.  Regular rate rhythm Resp:  Normal effort.  Clear to auscultation bilaterally Abd:  No distention.  Soft nontender Other:  Tenderness throughout the musculature of the posterior thigh and calf.  No bony point tenderness.  Intact range of motion   ED Results / Procedures / Treatments   Labs (all labs ordered are listed, but only abnormal results are displayed) Labs Reviewed  COMPREHENSIVE METABOLIC PANEL WITH GFR - Abnormal; Notable for the following components:      Result Value   CO2 21 (*)    Glucose, Bld 179 (*)    BUN 30 (*)    All other components within normal limits  CBC WITH DIFFERENTIAL/PLATELET - Abnormal; Notable for the following components:   WBC 13.6 (*)    Platelets 866 (*)    Neutro Abs 12.0 (*)    Abs Immature Granulocytes 0.08 (*)    All other components within normal limits  URINE DRUG SCREEN, QUALITATIVE ([facility] ONLY)     EKG    RADIOLOGY X-ray right femur and right tib-fib interpreted by me, negative for fracture.  Radiology report reviewed  CT head negative for acute findings   PROCEDURES:  Procedures   MEDICATIONS ORDERED IN ED: Medications  HYDROmorphone  (DILAUDID ) injection 1 mg (1 mg Intramuscular Given 08/03/23 2013)     IMPRESSION / MDM / ASSESSMENT AND PLAN / ED COURSE  I reviewed the triage vital signs and the nursing notes.  DDx: Intracranial mass, intracranial hemorrhage, femur fracture, tib-fib fracture, electrolyte derangement, AKI,  psychosis, UTI  Patient's presentation is most consistent with acute presentation with potential threat to life or bodily function.  Patient presents with symptoms of psychosis.  Particularly with  the firearm in the home, will IVC pending further evaluation by psychiatry.  CT x-rays and initial serum labs are all unremarkable, he is medically stable.  Will obtain urine when possible.  The patient has been placed in psychiatric observation due to the need to provide a safe environment for the patient while obtaining psychiatric consultation and evaluation, as well as ongoing medical and medication management to treat the patient's condition.  The patient has been placed under full IVC at this time.      FINAL CLINICAL IMPRESSION(S) / ED DIAGNOSES   Final diagnoses:  Paranoia (HCC)     Rx / DC Orders   ED Discharge Orders     None        Note:  This document was prepared using Dragon voice recognition software and may include unintentional dictation errors.   Jacquie Maudlin, MD 08/03/23 2155    Jacquie Maudlin, MD 08/03/23 2156

## 2023-08-03 NOTE — ED Triage Notes (Signed)
 Patient to ED via POV after a fall. Pt reports the back of his heel caught a step causing him to stretch out and fall on concrete. States he was able to catch himself with his hands. C/o pain from right buttock to calf. Denies hitting head or LOC. Pt states he believes he could pulled a hamstring.    After patient triage, patient's wife pulled this RN to the side concerned that patient is paranoid. Wife reports that patient is hoarding and asking the grandchildren if someone has bugged the house. Wife reports he has put cameras in the house. Ongoing x6 months but worse since started taking gabapentin 1 week ago.

## 2023-08-03 NOTE — ED Notes (Signed)
 Pt changed into burgundy scrubs per facility protocol by [facility] RN and [place], EDT.

## 2023-08-03 NOTE — ED Notes (Signed)
ivc by MD Stafford/psych consult ordered/pending.

## 2023-08-03 NOTE — ED Notes (Signed)
 Patient's wife informed on visitation hours and given printed paperwork on visiting policies. Wife verbalized understanding.

## 2023-08-04 ENCOUNTER — Telehealth: Payer: Self-pay | Admitting: Family Medicine

## 2023-08-04 DIAGNOSIS — F22 Delusional disorders: Secondary | ICD-10-CM | POA: Diagnosis not present

## 2023-08-04 LAB — URINE DRUG SCREEN, QUALITATIVE (ARMC ONLY)
Amphetamines, Ur Screen: POSITIVE — AB
Barbiturates, Ur Screen: NOT DETECTED
Benzodiazepine, Ur Scrn: NOT DETECTED
Cannabinoid 50 Ng, Ur ~~LOC~~: NOT DETECTED
Cocaine Metabolite,Ur ~~LOC~~: NOT DETECTED
MDMA (Ecstasy)Ur Screen: NOT DETECTED
Methadone Scn, Ur: NOT DETECTED
Opiate, Ur Screen: NOT DETECTED
Phencyclidine (PCP) Ur S: NOT DETECTED
Tricyclic, Ur Screen: NOT DETECTED

## 2023-08-04 LAB — URINALYSIS, W/ REFLEX TO CULTURE (INFECTION SUSPECTED)
Bacteria, UA: NONE SEEN
Bilirubin Urine: NEGATIVE
Glucose, UA: NEGATIVE mg/dL
Hgb urine dipstick: NEGATIVE
Ketones, ur: 5 mg/dL — AB
Leukocytes,Ua: NEGATIVE
Nitrite: NEGATIVE
Protein, ur: 30 mg/dL — AB
Specific Gravity, Urine: 1.027 (ref 1.005–1.030)
pH: 5 (ref 5.0–8.0)

## 2023-08-04 MED ORDER — GABAPENTIN 100 MG PO CAPS
100.0000 mg | ORAL_CAPSULE | Freq: Three times a day (TID) | ORAL | Status: DC
  Administered 2023-08-04: 100 mg via ORAL
  Filled 2023-08-04: qty 1

## 2023-08-04 NOTE — ED Provider Notes (Signed)
 Emergency Medicine Observation Re-evaluation Note  Physical Exam   BP (!) 167/72   Pulse 84   Temp 98 F (36.7 C) (Oral)   Resp 16   Ht 5\' 6"  (1.676 m)   Wt 99.8 kg   SpO2 98%   BMI 35.51 kg/m   Patient appears in no acute distress.  ED Course / MDM   No reported events during my shift at the time of this note.   Pt is awaiting dispo from consultants   Buell Carmin MD    Buell Carmin, MD 08/04/23 [phone]

## 2023-08-04 NOTE — BH Assessment (Signed)
 Comprehensive Clinical Assessment (CCA) Note  08/04/2023 Alejandro Hill [id number] Recommendations for Services/Supports/Treatments: Psych NP Stan Eans. determined pt. meets psychiatric inpatient criteria. Alejandro Hill is a 68 year old, English speaking, Caucasian male. Pt presented to [facility] ED voluntarily. Pt has been IVC'd by the EDP. Per triage note: After patient triage, patient's wife pulled this RN to the side concerned that patient is paranoid. Wife reports that patient is hoarding and asking the grandchildren if someone has bugged the house. Wife reports he has put cameras in the house. Ongoing x6 months but worse since started taking gabapentin 1 week ago.  Pt was resting upon this writer's arrival. Pt's speech was tangential throughout the assessment. Pt perseverated about a belief that his wife has untreated bipolar along with his 3 daughters having bipolar, and they are all conspiring against him. Pt expressed a desire to leave and to make sure that he leaves unbeknownst to his wife in order to get to his son's house. Pt was fixated on his family conflict and often redirected assessment answers to his strained relationship with his wife. Pt presented with a fixation on being stressed out about the fact that daughter has taken over the home, refusing to move out. Pt denied that he demands that people stay on separate levels of the home. Pt reported that he [facility]; however, his wife is unaware of this. Pt has absent insight and is paranoid. Pt denied having symptoms of depression or anxiety. Pt was oriented x4. Motor behavior was normal. Eye contact was good. Pt's mood is anxious; affect is congruent. The patient denied having SI, HI or AV/H.   Chief Complaint:  Chief Complaint  Patient presents with   Fall   Visit Diagnosis: GAD Paranoid Delusions    CCA Screening, Triage and Referral (STR)  Patient Reported Information How did you hear about us ? No data  recorded Referral name: No data recorded Referral phone number: No data recorded  Whom do you see for routine medical problems? No data recorded Practice/Facility Name: No data recorded Practice/Facility Phone Number: No data recorded Name of Contact: No data recorded Contact Number: No data recorded Contact Fax Number: No data recorded Prescriber Name: No data recorded Prescriber Address (if known): No data recorded  What Is the Reason for Your Visit/Call Today? No data recorded How Long Has This Been Causing You Problems? No data recorded What Do You Feel Would Help You the Most Today? No data recorded  Have You Recently Been in Any Inpatient Treatment (Hospital/Detox/Crisis Center/28-Day Program)? No data recorded Name/Location of Program/Hospital:No data recorded How Long Were You There? No data recorded When Were You Discharged? No data recorded  Have You Ever Received Services From [facility] Before? No data recorded Who Do You See at [facility]? No data recorded  Have You Recently Had Any Thoughts About Hurting Yourself? No data recorded Are You Planning to Commit Suicide/Harm Yourself At This time? No data recorded  Have you Recently Had Thoughts About Hurting Someone Alejandro Hill? No data recorded Explanation: No data recorded  Have You Used Any Alcohol or Drugs in the Past 24 Hours? No data recorded How Long Ago Did You Use Drugs or Alcohol? No data recorded What Did You Use and How Much? No data recorded  Do You Currently Have a Therapist/Psychiatrist? No data recorded Name of Therapist/Psychiatrist: No data recorded  Have You Been Recently Discharged From Any Office Practice or Programs? No data recorded Explanation of Discharge From  Practice/Program: No data recorded    CCA Screening Triage Referral Assessment Type of Contact: No data recorded Is this Initial or Reassessment? No data recorded Date Telepsych consult ordered in [facility]:  No data recorded Time Telepsych  consult ordered in [facility]:  No data recorded  Patient Reported Information Reviewed? No data recorded Patient Left Without Being Seen? No data recorded Reason for Not Completing Assessment: No data recorded  Collateral Involvement: No data recorded  Does Patient Have a Court Appointed Legal Guardian? No data recorded Name and Contact of Legal Guardian: No data recorded If Minor and Not Living with Parent(s), Who has Custody? No data recorded Is CPS involved or ever been involved? No data recorded Is APS involved or ever been involved? No data recorded  Patient Determined To Be At Risk for Harm To Self or Others Based on Review of Patient Reported Information or Presenting Complaint? No data recorded Method: No data recorded Availability of Means: No data recorded Intent: No data recorded Notification Required: No data recorded Additional Information for Danger to Others Potential: No data recorded Additional Comments for Danger to Others Potential: No data recorded Are There Guns or Other Weapons in Your Home? No data recorded Types of Guns/Weapons: No data recorded Are These Weapons Safely Secured?                            No data recorded Who Could Verify You Are Able To Have These Secured: No data recorded Do You Have any Outstanding Charges, Pending Court Dates, Parole/Probation? No data recorded Contacted To Inform of Risk of Harm To Self or Others: No data recorded  Location of Assessment: No data recorded  Does Patient Present under Involuntary Commitment? No data recorded IVC Papers Initial File Date: No data recorded  [place] of Residence: No data recorded  Patient Currently Receiving the Following Services: No data recorded  Determination of Need: No data recorded  Options For Referral: No data recorded    CCA Biopsychosocial Intake/Chief Complaint:  No data recorded Current Symptoms/Problems: No data recorded  Patient Reported Schizophrenia/Schizoaffective  Diagnosis in Past: No data recorded  Strengths: No data recorded Preferences: No data recorded Abilities: No data recorded  Type of Services Patient Feels are Needed: No data recorded  Initial Clinical Notes/Concerns: No data recorded  Mental Health Symptoms Depression:  No data recorded  Duration of Depressive symptoms: No data recorded  Mania:  No data recorded  Anxiety:   No data recorded  Psychosis:  No data recorded  Duration of Psychotic symptoms: No data recorded  Trauma:  No data recorded  Obsessions:  No data recorded  Compulsions:  No data recorded  Inattention:  No data recorded  Hyperactivity/Impulsivity:  No data recorded  Oppositional/Defiant Behaviors:  No data recorded  Emotional Irregularity:  No data recorded  Other Mood/Personality Symptoms:  No data recorded   Mental Status Exam Appearance and self-care  Stature:  No data recorded  Weight:  No data recorded  Clothing:  No data recorded  Grooming:  No data recorded  Cosmetic use:  No data recorded  Posture/gait:  No data recorded  Motor activity:  No data recorded  Sensorium  Attention:  No data recorded  Concentration:  No data recorded  Orientation:  No data recorded  Recall/memory:  No data recorded  Affect and Mood  Affect:  No data recorded  Mood:  No data recorded  Relating  Eye contact:  No  data recorded  Facial expression:  No data recorded  Attitude toward examiner:  No data recorded  Thought and Language  Speech flow: No data recorded  Thought content:  No data recorded  Preoccupation:  No data recorded  Hallucinations:  No data recorded  Organization:  No data recorded  [employer] of Knowledge:  No data recorded  Intelligence:  No data recorded  Abstraction:  No data recorded  Judgement:  No data recorded  Reality Testing:  No data recorded  Insight:  No data recorded  Decision Making:  No data recorded  Social Functioning  Social Maturity:  No data recorded   Social Judgement:  No data recorded  Stress  Stressors:  No data recorded  Coping Ability:  No data recorded  Skill Deficits:  No data recorded  Supports:  No data recorded    Religion:    Leisure/Recreation:    Exercise/Diet:     CCA Employment/Education Employment/Work Situation:    Education:     CCA Family/Childhood History Family and Relationship History:    Childhood History:     Child/Adolescent Assessment:     CCA Substance Use Alcohol/Drug Use:                           ASAM's:  Six Dimensions of Multidimensional Assessment  Dimension 1:  Acute Intoxication and/or Withdrawal Potential:      Dimension 2:  Biomedical Conditions and Complications:      Dimension 3:  Emotional, Behavioral, or Cognitive Conditions and Complications:     Dimension 4:  Readiness to Change:     Dimension 5:  Relapse, Continued use, or Continued Problem Potential:     Dimension 6:  Recovery/Living Environment:     ASAM Severity Score:    ASAM Recommended Level of Treatment:     Substance use Disorder (SUD)    Recommendations for Services/Supports/Treatments:    DSM5 Diagnoses: Patient Active Problem List   Diagnosis Date Noted   Chronic neck pain 06/07/2019   Anxiety disorder 08/25/2018   Hemorrhoids, internal, with bleeding 02/20/2014   Umbilical hernia 01/25/2014   OSA (obstructive sleep apnea) 03/02/2013   ASBESTOS EXPOSURE, HX OF 09/20/2009   Diabetes mellitus without complication (HCC) 08/14/2008   Hypothyroidism 06/02/2007   Hyperlipemia, mixed 06/02/2007   Essential hypertension 06/02/2007   Asthma 06/02/2007    Patient Centered Plan: Patient is on the following Treatment Plan(s):  Anxiety Pt has been recommended for inpatient treatment.   @[email or web address] @  Aviella Disbrow R Kadar Chance, LCAS

## 2023-08-04 NOTE — ED Notes (Signed)
 Pt awakened, requesting pain mediation for left leg. RN offered PRN ibuprofen , pt refused. RN messaged EDP of pt's request.

## 2023-08-04 NOTE — BH Assessment (Signed)
 Patient has been accepted to [facility].  Patient assigned to [facility].  Accepting physician is Dr. Lavona Pounds.  Call report to [phone].  Representative was [employer].   ER Staff is aware of it:  Gaynel Keel, ER Secretary  Dr. Margery Sheets, ER MD  Alray Askew, Patient's Nurse     Patient can arrive at the facility anytime after 11 AM 08/04/23.

## 2023-08-04 NOTE — ED Notes (Signed)
 Pt ambulated to and from bathroom with walker. Standby assistance provided.

## 2023-08-04 NOTE — ED Notes (Signed)
 Pt transferred to [facility] via [facility]. All belongings and transfer paperwork given to [facility]. Pt alert and oriented X4, cooperative, RR even and unlabored, color WNL. Pt in NAD.

## 2023-08-04 NOTE — ED Notes (Signed)
 EMTALA reviewed by this RN.

## 2023-08-04 NOTE — Telephone Encounter (Signed)
 First of all, I see from his chart he was transferred there from our ED for severe paranoia and concerns about his safety. It sounds like this was quite appropriate. Second of all, Leman never signed a release in his chart to allow us  to discuss his care with anyone other than his son Zoila Hines. Thirdly, I have nothing to do with his care in that facility.

## 2023-08-04 NOTE — Telephone Encounter (Signed)
 Reason for CRM: Patient's sister, Jim Motts, called in stating that her brother is currently at [facility] in [place], [place] and she is requesting to speak with someone in office as she is trying to get him out of that facility. Callback number for Jim Motts is [phone].

## 2023-08-04 NOTE — BH Assessment (Addendum)
 Destination  Service Provider Request Status Services Address Phone Fax Patient Preferred  [facility] Pending - Request Sent -- [address] [place], [place] [address] [phone] --  [facility] Pending - Request Sent -- [address] [place] [address] [phone] --  [facility] Pending - Request Sent -- [address] [place] [address] [phone] --  [facility] Pending - Request Sent -- [address] [place] [phone] --  [facility] Pending - Request Sent -- [address] [place] [address] [phone] --  [facility] Pending - Request Sent -- [address], [place] [address] [phone] --  [facility] Pending - Request Sent -- [address], [place] [address] [phone] --  [facility] Pending - Request Sent -- [address] [place] [address] [phone] --  [facility] Pending - Request Sent -- [address] [place] [address] [phone] --  [facility] Pending - Request Sent -- [address] [place], [place] [address] [phone] --  [facility] Pending - No Request Sent -- [address], [place] [address] [phone]

## 2023-08-04 NOTE — Consult Note (Signed)
 [facility] Psychiatric Consult Initial  Patient Name: .Alejandro Hill  MRN: [id number]  DOB: 1955-04-19  Consult Order details:  Orders (From admission, onward)     Start     Ordered   08/03/23 2156  CONSULT TO CALL ACT TEAM       Ordering Provider: Jacquie Maudlin, MD  Provider:  (Not yet assigned)  Question:  Reason for Consult?  Answer:  Psych consult   08/03/23 2155   08/03/23 2156  IP CONSULT TO PSYCHIATRY       Ordering Provider: Jacquie Maudlin, MD  Provider:  (Not yet assigned)  Question Answer Comment  Consult Timeframe URGENT - requires response within 12 hours   URGENT timeframe requires provider to provider communication, has the provider to provider communication been completed Yes   Reason for Consult? Consult for medication management   Contact phone number where the requesting provider can be reached [phone]      08/03/23 2155             Mode of Visit: Tele-visit Virtual Statement:TELE PSYCHIATRY ATTESTATION & CONSENT As the provider for this telehealth consult, I attest that I verified the patient's identity using two separate identifiers, introduced myself to the patient, provided my credentials, disclosed my location, and performed this encounter via a HIPAA-compliant, real-time, face-to-face, two-way, interactive audio and video platform and with the full consent and agreement of the patient (or guardian as applicable.) Patient physical location: [facility]. Telehealth provider physical location: home office in state of [place] .   Video start time:   Video end time:      Psychiatry Consult Evaluation  Service Date: Aug 04, 2023 LOS:  LOS: 0 days  Chief Complaint  Paranoia ,Anxiety  Primary Psychiatric Diagnoses  Delusional disorder persecutory 2.  Schizophrenia paranoid 3.  MDD with psychotic features  Assessment  Alejandro Hill is a 68 y.o. male admitted: Presented to the EDfor 08/03/2023  4:14 PM for for worsening paranoia,  insomnia, and interpersonal conflict under IVC. He carries the psychiatric diagnosis of paranoid schizophrenia and has a past medical history of hypertension and hyperlipidemia.  His current presentation of increased paranoia, sleep disturbance, and persecutory ideation is most consistent with acute exacerbation of schizophrenia. He meets criteria for psychiatric hospitalization based on dangerousness to self or others, impaired judgment, and inability to maintain safety at home due to escalating behavioral concerns and weapon possession.  Current outpatient psychotropic medications include unknown. He was likely noncompliant with medications prior to admission as evidenced by clinical decompensation, poor sleep, and escalating paranoia. On initial examination, patient appears initially guarded, and suspicious of wifes motive. No overt psychosis observed, but behavior and speech suggest disordered thinking and persecutory beliefs.  Please see plan below for detailed recommendations.  Diagnoses:  Active Hospital problems: Active Problems:   * No active hospital problems. *    Plan   ## Psychiatric Medication Recommendations:  [place]  ## Medical Decision Making Capacity: Not specifically addressed in this encounter  ## Disposition:-- We recommend inpatient psychiatric hospitalization when medically cleared. Patient is under voluntary admission status at this time; please IVC if attempts to leave hospital.  ## Behavioral / Environmental: - No specific recommendations at this time.     ## Safety and Observation Level:  - Based on my clinical evaluation, I estimate the patient to be at low risk of self harm in the current setting. - At this time, we recommend  routine. This decision is based on  my review of the chart including patient's history and current presentation, interview of the patient, mental status examination, and consideration of suicide risk including evaluating suicidal  ideation, plan, intent, suicidal or self-harm behaviors, risk factors, and protective factors. This judgment is based on our ability to directly address suicide risk, implement suicide prevention strategies, and develop a safety plan while the patient is in the clinical setting. Please contact our team if there is a concern that risk level has changed.  CSSR Risk Category:C-SSRS RISK CATEGORY: No Risk  Suicide Risk Assessment: Patient has following modifiable risk factors for suicide: medication noncompliance, which we are addressing by inpatient education. Patient has following non-modifiable or demographic risk factors for suicide: male gender Patient has the following protective factors against suicide: Supportive family  Thank you for this consult request. Recommendations have been communicated to the primary team.  We will recomment inpatient admission at this time.   Nalia Honeycutt, NP       History of Present Illness  Relevant Aspects of Hospital ED Course:  Admitted on 08/03/2023 for Increasing paranoia.  Patient Report:  Alejandro Hill is a 68 y.o. male admitted: Presented to the ED on 08/03/2023 at 4:14 PM under an IVC for increasing paranoia and anxiety. The patient reports that his family is against him, despite him being the primary financial and domestic provider. 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 He describes his wife as lazy and expresses frustration that his daughter and grandchildren live in the home and have "taken over," forcing him to isolate upstairs. Patient reports only sleeping two hours and endorses increased irritability and stress. He denies suicidal ideation (SI), homicidal ideation (HI), and auditory or visual hallucinations (AVH).  Wife reports that patient has a firearm and has exhibited aggressive behaviors at home. Patient  is alert and oriented during the assessment, but displays significant paranoia and hypervigilance. Speech is pressured and affect is constricted. Insight and judgment appear impaired. No evidence of psychosis noted during this encounter.  Psych ROS:  Depression: denies Anxiety:  yes Mania (lifetime and current): no Psychosis: (lifetime and current): no  Review of Systems  Constitutional: Negative.   HENT: Negative.    Eyes: Negative.   Respiratory: Negative.    Cardiovascular: Negative.   Gastrointestinal: Negative.   Genitourinary: Negative.   Musculoskeletal:  Positive for falls.  Skin: Negative.   Neurological: Negative.   Psychiatric/Behavioral:  The patient is nervous/anxious.      Psychiatric and Social History  Psychiatric History:  Information collected from Patient chart   Prev Dx/Sx: denies Current Psych Provider: unknown Home Meds (current): unknown Previous Med Trials: unknown Therapy: [facility]  Prior Psych Hospitalization: denies  Prior Self Harm: no Prior Violence: denies  Family Psych History: unknown Family Hx suicide: unknown  Social History:  Developmental Hx: unknown Educational Hx: unknown Occupational Hx: Chief of Staff Hx: unknown Living Situation: with wife and other family members Spiritual Hx: unknown Access to weapons/lethal means: yes   Substance History Alcohol: yes  Type of alcohol wine Last Drink month ago Number of drinks per day 0 History of alcohol withdrawal seizures none History of DT's none Tobacco: denies Illicit drugs: no Prescription drug abuse: denies Rehab hx: unknown  Exam Findings  Physical Vital Signs:  Temp:  [97.5 F (36.4 C)-98.2 F (36.8 C)] 98 F (36.7 C) (05/06 0533) Pulse Rate:  [67-84] 84 (  05/06 0533) Resp:  [16-17] 16 (05/06 0533) BP: (141-182)/(72-83) 167/72 (05/06 0533) SpO2:  [97 %-98 %] 98 % (05/06 0533) Weight:  [99.8 kg] 99.8 kg (05/05 1552) Blood pressure (!) 167/72, pulse 84,  temperature 98 F (36.7 C), temperature source Oral, resp. rate 16, height 5\' 6"  (1.676 m), weight 99.8 kg, SpO2 98%. Body mass index is 35.51 kg/m.  Physical Exam HENT:     Head: Normocephalic.     Nose: Nose normal.  Eyes:     Extraocular Movements: Extraocular movements intact.  Pulmonary:     Effort: Pulmonary effort is normal.  Musculoskeletal:        General: Signs of injury present.     Cervical back: Normal range of motion.  Skin:    General: Skin is dry.  Neurological:     Mental Status: He is alert.     Other History   These have been pulled in through the EMR, reviewed, and updated if appropriate.  Family History:  The patient's family history includes Heart disease in his mother and another family member; Hyperlipidemia in an other family member; Liver disease in his father; Lung cancer in his father; Stroke in an other family member.  Medical History: Past Medical History:  Diagnosis Date   Allergy    Anemia    as a child   Asthma    exertional, seasonal (spring)   Cataract    Diabetes mellitus    type II diabetes     Heart murmur    as a child   Hemorrhoids    Hyperlipidemia    Hypertension    Hypogonadism male    sees Dr. Joie Narrow   Kidney stone    Migraine    cluster, [facility]   Pneumonia 2012   Seasonal allergies    Sleep apnea    , uses cpap- no longer used 02-11-23   Thyroid  disease    hypothyroidism - not taking medications except for [facility]   Umbilical hernia     Surgical History: Past Surgical History:  Procedure Laterality Date   CATARACT EXTRACTION Bilateral 01/30/2011   with lens implant   COLONOSCOPY  01/18/2019   Cologuard was negative   EXTRACORPOREAL SHOCK WAVE LITHOTRIPSY Left 03/26/2020   Procedure: EXTRACORPOREAL SHOCK WAVE LITHOTRIPSY (ESWL);  Surgeon: Andrez Banker, MD;  [facility];  Service: Urology;  Laterality: Left;   EYE SURGERY Bilateral    RK in his  89's, lasik when he was 55   HEMORRHOID SURGERY N/A 02/20/2014   Procedure: HEMORRHOIDECTOMY;  Surgeon: Lillette Reid III, MD;  Location: [facility] OR;  Service: General;  Laterality: N/A;   HERNIA REPAIR  08/03/2007   left inguinal herniorrhaphy. Dr Alethea Andes   INSERTION OF MESH N/A 02/20/2014   Procedure: INSERTION OF MESH;  Surgeon: Lillette Reid III, MD;  Location: [facility] OR;  Service: General;  Laterality: N/A;   TONSILLECTOMY     UMBILICAL HERNIA REPAIR  02/20/2014   & EXTERNAL HEMORRHOROIDS     DR TOTH   UMBILICAL HERNIA REPAIR N/A 02/20/2014   Procedure: HERNIA REPAIR UMBILICAL ADULT;  Surgeon: Lillette Reid III, MD;  Location: [facility] OR;  Service: General;  Laterality: N/A;   UVULOPALATOPHARYNGOPLASTY     per Dr Franklin Ito   VASECTOMY     WISDOM TOOTH EXTRACTION       Medications:   Current Facility-Administered Medications:    alum & mag hydroxide-simeth (MAALOX/MYLANTA) 200-200-20 MG/5ML suspension 30 mL, 30  mL, Oral, Q6H PRN, Jacquie Maudlin, MD   gabapentin (NEURONTIN) capsule 100 mg, 100 mg, Oral, TID, Buell Carmin, MD, 100 mg at 08/04/23 3500   ibuprofen  (ADVIL ) tablet 600 mg, 600 mg, Oral, Q8H PRN, Jacquie Maudlin, MD   ondansetron  (ZOFRAN ) tablet 4 mg, 4 mg, Oral, Q8H PRN, Jacquie Maudlin, MD  Current Outpatient Medications:    ascorbic acid (VITAMIN C) 1000 MG tablet, Take 1,000 mg by mouth daily., Disp: , Rfl:    B Complex Vitamins (VITAMIN B COMPLEX PO), Take 1 tablet by mouth daily., Disp: , Rfl:    Cholecalciferol (VITAMIN D-3) 125 MCG (5000 UT) TABS, Take 1 tablet by mouth daily., Disp: , Rfl:    co-enzyme Q-10 30 MG capsule, Take 30 mg by mouth daily., Disp: , Rfl:    fexofenadine -pseudoephedrine  (ALLEGRA-D ALLERGY & CONGESTION) 180-240 MG 24 hr tablet, Take 1 tablet by mouth daily. (Patient not taking: Reported on 02/11/2023), Disp: 90 tablet, Rfl: 3   HAWTHORNE BERRY PO, Take by mouth daily., Disp: , Rfl:    ivermectin (STROMECTOL) 3 MG TABS tablet, Take 12 mg by mouth every Sunday.,  Disp: , Rfl:    KRILL OIL PO, Take 1 capsule by mouth daily., Disp: , Rfl:    Multiple Vitamin (MULTIVITAMIN) tablet, Take 1 tablet by mouth daily., Disp: , Rfl:    NON FORMULARY, 5 mcg daily. Sea Kelp (Patient not taking: Reported on 02/11/2023), Disp: , Rfl:    OVER THE COUNTER MEDICATION, Take 2 tablets by mouth daily. Triple strength curimin supplement, Disp: , Rfl:    predniSONE  (DELTASONE ) 5 MG tablet, Take 1 tablet (5 mg total) by mouth daily with breakfast., Disp: 90 tablet, Rfl: 3   Red Yeast Rice Extract (RED YEAST RICE PO), Take 1 capsule by mouth 2 (two) times daily. , Disp: , Rfl:    rosuvastatin  (CRESTOR ) 5 MG tablet, Take 1 tablet (5 mg total) by mouth daily., Disp: 90 tablet, Rfl: 3   vitamin k 100 MCG tablet, Take 100 mcg by mouth daily., Disp: , Rfl:   Allergies: Allergies  Allergen Reactions   Cefuroxime Axetil Other (See Comments)    headaches   Codeine Nausea And Vomiting   Erythromycin Other (See Comments)    headaches   Morphine  And Codeine Other (See Comments)    Tight stool   Ppd [Tuberculin Purified Protein Derivative]     other   Sulfa Antibiotics Hives   Norco [Hydrocodone -Acetaminophen ] Rash and Other (See Comments)    hallucinations   Penicillins Rash    Shaquel Josephson, NP

## 2023-08-04 NOTE — ED Provider Notes (Signed)
-----------------------------------------   8:34 AM on 08/04/2023 -----------------------------------------   Blood pressure (!) 141/75, pulse 69, temperature 98 F (36.7 C), resp. rate 16, height 5\' 6"  (1.676 m), weight 99.8 kg, SpO2 98%.  The patient is calm and cooperative at this time.  Patient accepted to [facility] for ongoing psychiatric care.   Kandee Orion, MD 08/04/23 [phone]

## 2023-08-07 NOTE — Telephone Encounter (Signed)
 Spoke with Jim Motts advised of Dr Alyne Babinski response on this message, verbalized understanding

## 2023-10-19 ENCOUNTER — Ambulatory Visit (INDEPENDENT_AMBULATORY_CARE_PROVIDER_SITE_OTHER): Payer: Medicare Other

## 2023-10-19 VITALS — Ht 66.0 in | Wt 218.0 lb

## 2023-10-19 DIAGNOSIS — Z0000 Encounter for general adult medical examination without abnormal findings: Secondary | ICD-10-CM | POA: Diagnosis not present

## 2023-10-19 NOTE — Progress Notes (Signed)
 Subjective:   Alejandro Hill is a 68 y.o. who presents for a Medicare Wellness preventive visit.  As a reminder, Annual Wellness Visits don't include a physical exam, and some assessments may be limited, especially if this visit is performed virtually. We may recommend an in-person follow-up visit with your provider if needed.  Visit Complete: Virtual I connected with  Alejandro Hill on 10/19/23 by a audio enabled telemedicine application and verified that I am speaking with the correct person using two identifiers.  Patient Location: Home  Provider Location: Home Office  I discussed the limitations of evaluation and management by telemedicine. The patient expressed understanding and agreed to proceed.  Vital Signs: Because this visit was a virtual/telehealth visit, some criteria may be missing or patient reported. Any vitals not documented were not able to be obtained and vitals that have been documented are patient reported.    Persons Participating in Visit: Patient.  AWV Questionnaire: No: Patient Medicare AWV questionnaire was not completed prior to this visit.  Cardiac Risk Factors include: advanced age (>64men, >22 women);male gender;diabetes mellitus;hypertension     Objective:    Today's Vitals   10/19/23 1346  Weight: 218 lb (98.9 kg)  Height: 5' 6 (1.676 m)   Body mass index is 35.19 kg/m.     10/19/2023    2:04 PM 08/03/2023    3:52 PM 10/15/2022    2:15 PM 10/10/2021    1:57 PM 10/08/2020   10:44 AM 03/26/2020    9:57 AM 02/21/2014    8:21 AM  Advanced Directives  Does Patient Have a Medical Advance Directive? Yes Yes Yes Yes Yes Yes No   Type of Estate agent of [place];Living will Healthcare Power of [place];Living will Healthcare Power of [place];Living will Healthcare Power of [place];Living will Healthcare Power of [place];Living will    Does patient want to make changes to medical advance directive?    No - Patient declined  No -  Guardian declined   Copy of Healthcare Power of Attorney in Chart? No - copy requested  No - copy requested No - copy requested No - copy requested    Would patient like information on creating a medical advance directive?      No - Patient declined Yes - Transport planner given      Data saved with a previous flowsheet row definition    Current Medications (verified) Outpatient Encounter Medications as of 10/19/2023  Medication Sig   ascorbic acid (VITAMIN C) 1000 MG tablet Take 1,000 mg by mouth daily.   B Complex Vitamins (VITAMIN B COMPLEX PO) Take 1 tablet by mouth daily.   Cholecalciferol (VITAMIN D-3) 125 MCG (5000 UT) TABS Take 1 tablet by mouth daily.   co-enzyme Q-10 30 MG capsule Take 30 mg by mouth daily.   fexofenadine -pseudoephedrine  (ALLEGRA-D ALLERGY & CONGESTION) 180-240 MG 24 hr tablet Take 1 tablet by mouth daily. (Patient not taking: Reported on 02/11/2023)   HAWTHORNE BERRY PO Take by mouth daily.   ivermectin (STROMECTOL) 3 MG TABS tablet Take 12 mg by mouth every Sunday.   KRILL OIL PO Take 1 capsule by mouth daily.   Multiple Vitamin (MULTIVITAMIN) tablet Take 1 tablet by mouth daily.   NON FORMULARY 5 mcg daily. [place] (Patient not taking: Reported on 02/11/2023)   OVER THE COUNTER MEDICATION Take 2 tablets by mouth daily. Triple strength curimin supplement   predniSONE  (DELTASONE ) 5 MG tablet Take 1 tablet (5 mg total) by mouth  daily with breakfast.   Red Yeast Rice Extract (RED YEAST RICE PO) Take 1 capsule by mouth 2 (two) times daily.    rosuvastatin  (CRESTOR ) 5 MG tablet Take 1 tablet (5 mg total) by mouth daily.   vitamin k 100 MCG tablet Take 100 mcg by mouth daily.   No facility-administered encounter medications on file as of 10/19/2023.    Allergies (verified) Cefuroxime axetil, Codeine, Erythromycin, Morphine  and codeine, Ppd [tuberculin purified protein derivative], Sulfa antibiotics, Norco [hydrocodone -acetaminophen ], and Penicillins    History: Past Medical History:  Diagnosis Date   Allergy    Anemia    as a child   Asthma    exertional, seasonal (spring)   Cataract    Diabetes mellitus    type II diabetes     Heart murmur    as a child   Hemorrhoids    Hyperlipidemia    Hypertension    Hypogonadism male    sees Dr. Matilda   Kidney stone    Migraine    cluster, had [facility]   Pneumonia 2012   Seasonal allergies    Sleep apnea    , uses cpap- no longer used 02-11-23   Thyroid  disease    hypothyroidism - not taking medications except for [facility]   Umbilical hernia    Past Surgical History:  Procedure Laterality Date   CATARACT EXTRACTION Bilateral 01/30/2011   with lens implant   COLONOSCOPY  01/18/2019   Cologuard was negative   EXTRACORPOREAL SHOCK WAVE LITHOTRIPSY Left 03/26/2020   Procedure: EXTRACORPOREAL SHOCK WAVE LITHOTRIPSY (ESWL);  Surgeon: Cam Morene ORN, MD;  Location: [facility];  Service: Urology;  Laterality: Left;   EYE SURGERY Bilateral    RK in his 29's, lasik when he was 51   HEMORRHOID SURGERY N/A 02/20/2014   Procedure: HEMORRHOIDECTOMY;  Surgeon: Deward Null III, MD;  Location: [facility] OR;  Service: General;  Laterality: N/A;   HERNIA REPAIR  08/03/2007   left inguinal herniorrhaphy. Dr Null   INSERTION OF MESH N/A 02/20/2014   Procedure: INSERTION OF MESH;  Surgeon: Deward Null III, MD;  Location: [facility] OR;  Service: General;  Laterality: N/A;   TONSILLECTOMY     UMBILICAL HERNIA REPAIR  02/20/2014   & EXTERNAL HEMORRHOROIDS     DR TOTH   UMBILICAL HERNIA REPAIR N/A 02/20/2014   Procedure: HERNIA REPAIR UMBILICAL ADULT;  Surgeon: Deward Null III, MD;  Location: [facility] OR;  Service: General;  Laterality: N/A;   UVULOPALATOPHARYNGOPLASTY     per Dr Floy   VASECTOMY     WISDOM TOOTH EXTRACTION     Family History  Problem Relation Age of Onset   Heart disease Mother    Liver disease Father    Lung cancer Father    Heart disease Other     Hyperlipidemia Other    Stroke Other    Colon cancer Neg Hx    Esophageal cancer Neg Hx    Stomach cancer Neg Hx    Rectal cancer Neg Hx    Social History   Socioeconomic History   Marital status: Married    Spouse name: Not on file   Number of children: Not on file   Years of education: Not on file   Highest education level: Not on file  Occupational History   Occupation: self employed  Tobacco Use   Smoking status: Never   Smokeless tobacco: Never  Vaping Use   Vaping status: Never Used  Substance and Sexual Activity   Alcohol use: Yes    Alcohol/week: 0.0 standard drinks of alcohol    Comment: 1-2 drink per week   Drug use: No   Sexual activity: Not on file  Other Topics Concern   Not on file  Social History Narrative   Regular Exercise:  Yes   Religion affecting care [facility] Witness) so will not accept blood products.   Social Drivers of Corporate investment banker Strain: Low Risk  (10/19/2023)   Overall Financial Resource Strain (CARDIA)    Difficulty of Paying Living Expenses: Not hard at all  Food Insecurity: No Food Insecurity (10/19/2023)   Hunger Vital Sign    Worried About Running Out of Food in the Last Year: Never true    Ran Out of Food in the Last Year: Never true  Transportation Needs: No Transportation Needs (10/19/2023)   PRAPARE - Administrator, Civil Service (Medical): No    Lack of Transportation (Non-Medical): No  Physical Activity: Sufficiently Active (10/19/2023)   Exercise Vital Sign    Days of Exercise per Week: 7 days    Minutes of Exercise per Session: 60 min  Stress: No Stress Concern Present (10/19/2023)   [employer] of Occupational Health - Occupational Stress Questionnaire    Feeling of Stress: Not at all  Social Connections: Moderately Integrated (10/19/2023)   Social Connection and Isolation Panel    Frequency of Communication with Friends and Family: More than three times a week    Frequency of Social  Gatherings with Friends and Family: More than three times a week    Attends Religious Services: More than 4 times per year    Active Member of [employer] or Organizations: Yes    Attends Engineer, structural: More than 4 times per year    Marital Status: Separated    Tobacco Counseling Counseling given: Not Answered    Clinical Intake:  Pre-visit preparation completed: Yes  Pain : No/denies pain     BMI - recorded: 35.19 Nutritional Status: BMI > 30  Obese Nutritional Risks: None Diabetes: Yes CBG done?: No Did pt. bring in CBG monitor from home?: No  Lab Results  Component Value Date   HGBA1C 7.7 (H) 11/27/2022   HGBA1C 5.6 10/19/2020   HGBA1C 5.7 10/19/2018     How often do you need to have someone help you when you read instructions, pamphlets, or other written materials from your doctor or pharmacy?: 1 - Never  Interpreter Needed?: No  Information entered by :: Rojelio Blush LPN   Activities of Daily Living     10/19/2023    2:01 PM  In your present state of health, do you have any difficulty performing the following activities:  Hearing? 0  Vision? 0  Difficulty concentrating or making decisions? 0  Walking or climbing stairs? 0  Dressing or bathing? 0  Doing errands, shopping? 0  Preparing Food and eating ? N  Using the Toilet? N  In the past six months, have you accidently leaked urine? N  Do you have problems with loss of bowel control? N  Managing your Medications? N  Managing your Finances? N  Housekeeping or managing your Housekeeping? N    Patient Care Team: Johnny Garnette LABOR, MD as PCP - General Asa, Aloysius LABOR, MD (Inactive) as Referring Physician (Allergy)  I have updated your Care Teams any recent Medical Services you may have received from other providers in the past year.  Assessment:   This is a routine wellness examination for Alejandro Hill.  Hearing/Vision screen Hearing Screening - Comments:: Denies hearing difficulties    Vision Screening - Comments:: Wears rx glasses - up to date with routine eye exams with  [facility]   Goals Addressed               This Visit's Progress     Patient stated (pt-stated)        Lose weight.       Depression Screen     10/19/2023    2:00 PM 10/15/2022    2:11 PM 10/10/2021    1:54 PM 09/11/2021    8:32 AM 10/08/2020   10:48 AM 10/08/2020   10:41 AM  PHQ 2/9 Scores  PHQ - 2 Score 0 0 0 0 0 0  PHQ- 9 Score   0 2      Fall Risk     10/19/2023    2:02 PM 10/15/2022    2:14 PM 10/10/2021    1:56 PM 10/09/2021    6:41 PM 09/11/2021    8:32 AM  Fall Risk   Falls in the past year? 0 0 0 0 0  Number falls in past yr: 0 0 0 0 0  Injury with Fall? 0 0 0 0 0  Risk for fall due to : No Fall Risks No Fall Risks No Fall Risks  No Fall Risks  Follow up Falls evaluation completed Falls prevention discussed   Falls evaluation completed      Data saved with a previous flowsheet row definition    MEDICARE RISK AT HOME:  Medicare Risk at Home Any stairs in or around the home?: No If so, are there any without handrails?: No Home free of loose throw rugs in walkways, pet beds, electrical cords, etc?: Yes Adequate lighting in your home to reduce risk of falls?: Yes Life alert?: No Use of a cane, walker or w/c?: No Grab bars in the bathroom?: Yes Shower chair or bench in shower?: No Elevated toilet seat or a handicapped toilet?: No  TIMED UP AND GO:  Was the test performed?  No  Cognitive Function: 6CIT completed        10/19/2023    2:04 PM 10/15/2022    2:16 PM 10/10/2021    1:58 PM  6CIT Screen  What Year? 0 points 0 points 0 points  What month? 0 points 0 points 0 points  What time? 0 points 0 points 0 points  Count back from 20 0 points 0 points 0 points  Months in reverse 0 points 0 points 0 points  Repeat phrase 0 points 0 points 0 points  Total Score 0 points 0 points 0 points    Immunizations  There is no immunization history on file for this  patient.  Screening Tests Health Maintenance  Topic Date Due   FOOT EXAM  Never done   OPHTHALMOLOGY EXAM  Never done   Hepatitis C Screening  Never done   DTaP/Tdap/Td (1 - Tdap) Never done   Pneumococcal Vaccine: 50+ Years (1 of 2 - PCV) Never done   Zoster Vaccines- Shingrix (1 of 2) Never done   Diabetic kidney evaluation - Urine ACR  08/14/2009   COVID-19 Vaccine (1 - 2024-25 season) Never done   HEMOGLOBIN A1C  05/29/2023   Diabetic kidney evaluation - eGFR measurement  08/02/2024   Medicare Annual Wellness (AWV)  10/18/2024   Colonoscopy  02/10/2026   Hepatitis B  Vaccines  Aged Out   HPV VACCINES  Aged Out   Meningococcal B Vaccine  Aged Out    Health Maintenance  Health Maintenance Due  Topic Date Due   FOOT EXAM  Never done   OPHTHALMOLOGY EXAM  Never done   Hepatitis C Screening  Never done   DTaP/Tdap/Td (1 - Tdap) Never done   Pneumococcal Vaccine: 50+ Years (1 of 2 - PCV) Never done   Zoster Vaccines- Shingrix (1 of 2) Never done   Diabetic kidney evaluation - Urine ACR  08/14/2009   COVID-19 Vaccine (1 - 2024-25 season) Never done   HEMOGLOBIN A1C  05/29/2023   Health Maintenance Items Addressed: Labs and vaccines deferred  Additional Screening:  Vision Screening: Recommended annual ophthalmology exams for early detection of glaucoma and other disorders of the eye. Would you like a referral to an eye doctor? No    Dental Screening: Recommended annual dental exams for proper oral hygiene  Community Resource Referral / Chronic Care Management: CRR required this visit?  No   CCM required this visit?  No   Plan:    I have personally reviewed and noted the following in the patient's chart:   Medical and social history Use of alcohol, tobacco or illicit drugs  Current medications and supplements including opioid prescriptions. Patient is not currently taking opioid prescriptions. Functional ability and status Nutritional status Physical  activity Advanced directives List of other physicians Hospitalizations, surgeries, and ER visits in previous 12 months Vitals Screenings to include cognitive, depression, and falls Referrals and appointments  In addition, I have reviewed and discussed with patient certain preventive protocols, quality metrics, and best practice recommendations. A written personalized care plan for preventive services as well as general preventive health recommendations were provided to patient.   Rojelio LELON Blush, LPN   [id number]/7974   After Visit Summary: (MyChart) Due to this being a telephonic visit, the after visit summary with patients personalized plan was offered to patient via MyChart   Notes: Nothing significant to report at this time.

## 2023-10-19 NOTE — Patient Instructions (Addendum)
 [name] , Thank you for taking time out of your busy schedule to complete your Annual Wellness Visit with me. I enjoyed our conversation and look forward to speaking with you again next year. I, as well as your care team,  appreciate your ongoing commitment to your health goals. Please review the following plan we discussed and let me know if I can assist you in the future. Your Game plan/ To Do List    Referrals: If you haven't heard from the office you've been referred to, please reach out to them at the phone provided.   Follow up Visits: Next Medicare AWV with our clinical staff: 10/24/24 @ 1:40p   Have you seen your provider in the last 6 months (3 months if uncontrolled diabetes)? 11/27/22 Next Office Visit with your provider:   Clinician Recommendations:  Aim for 30 minutes of exercise or brisk walking, 6-8 glasses of water, and 5 servings of fruits and vegetables each day.       This is a list of the screening recommended for you and due dates:  Health Maintenance  Topic Date Due   Complete foot exam   Never done   Eye exam for diabetics  Never done   Hepatitis C Screening  Never done   DTaP/Tdap/Td vaccine (1 - Tdap) Never done   Pneumococcal Vaccine for age over 33 (1 of 2 - PCV) Never done   Zoster (Shingles) Vaccine (1 of 2) Never done   Yearly kidney health urinalysis for diabetes  08/14/2009   COVID-19 Vaccine (1 - 2024-25 season) Never done   Hemoglobin A1C  05/29/2023   Yearly kidney function blood test for diabetes  08/02/2024   Medicare Annual Wellness Visit  10/18/2024   Colon Cancer Screening  02/10/2026   Hepatitis B Vaccine  Aged Out   HPV Vaccine  Aged Out   Meningitis B Vaccine  Aged Out    Advanced directives: (Copy Requested) Please bring a copy of your health care power of attorney and living will to the office to be added to your chart at your convenience. You can mail to [facility] [address] [place], [place] [address] or email to  [email or web address] Advance Care Planning is important because it:  [x]  Makes sure you receive the medical care that is consistent with your values, goals, and preferences  [x]  It provides guidance to your family and loved ones and reduces their decisional burden about whether or not they are making the right decisions based on your wishes.  Follow the link provided in your after visit summary or read over the paperwork we have mailed to you to help you started getting your Advance Directives in place. If you need assistance in completing these, please reach out to us  so that we can help you!  See attachments for Preventive Care and Fall Prevention Tips.

## 2024-10-24 ENCOUNTER — Ambulatory Visit
